# Patient Record
Sex: Male | Born: 1985 | Hispanic: Yes | Marital: Single | Smoking: Former smoker
Health system: Southern US, Community
[De-identification: ages and names within clinical notes are randomized; demographics above are authoritative.]

---

## 2008-09-26 ENCOUNTER — Emergency Department (HOSPITAL_COMMUNITY): Admission: EM | Admit: 2008-09-26 | Discharge: 2008-09-27 | Payer: Self-pay | Admitting: Emergency Medicine

## 2008-09-30 ENCOUNTER — Emergency Department (HOSPITAL_COMMUNITY): Admission: EM | Admit: 2008-09-30 | Discharge: 2008-09-30 | Payer: Self-pay | Admitting: Emergency Medicine

## 2013-03-15 ENCOUNTER — Encounter (HOSPITAL_COMMUNITY): Payer: Self-pay

## 2013-03-15 ENCOUNTER — Emergency Department (HOSPITAL_COMMUNITY)
Admission: EM | Admit: 2013-03-15 | Discharge: 2013-03-15 | Disposition: A | Discharge status: Home / Self Care | Payer: Self-pay | Attending: Emergency Medicine | Admitting: Emergency Medicine

## 2013-03-15 DIAGNOSIS — L237 Allergic contact dermatitis due to plants, except food: Secondary | ICD-10-CM

## 2013-03-15 DIAGNOSIS — IMO0002 Reserved for concepts with insufficient information to code with codable children: Secondary | ICD-10-CM | POA: Insufficient documentation

## 2013-03-15 DIAGNOSIS — Y929 Unspecified place or not applicable: Secondary | ICD-10-CM | POA: Insufficient documentation

## 2013-03-15 DIAGNOSIS — L255 Unspecified contact dermatitis due to plants, except food: Secondary | ICD-10-CM | POA: Insufficient documentation

## 2013-03-15 DIAGNOSIS — Y939 Activity, unspecified: Secondary | ICD-10-CM | POA: Insufficient documentation

## 2013-03-15 MED ORDER — HYDROXYZINE HCL 25 MG PO TABS
50.0000 mg | ORAL_TABLET | Freq: Once | ORAL | Status: AC
  Administered 2013-03-15: 50 mg via ORAL
  Filled 2013-03-15: qty 2

## 2013-03-15 MED ORDER — DEXAMETHASONE SODIUM PHOSPHATE 10 MG/ML IJ SOLN
10.0000 mg | Freq: Once | INTRAMUSCULAR | Status: AC
  Administered 2013-03-15: 10 mg via INTRAMUSCULAR
  Filled 2013-03-15: qty 1

## 2013-03-15 MED ORDER — HYDROXYZINE HCL 25 MG PO TABS: 50.0000 mg | ORAL_TABLET | Freq: Four times a day (QID) | ORAL | Status: DC | PRN

## 2013-03-15 MED ORDER — PREDNISONE 10 MG PO TABS: ORAL_TABLET | ORAL | Status: DC

## 2013-03-15 NOTE — ED Provider Notes (Signed)
   History    CSN: [id number] Arrival date & time 03/15/13  1125  First MD Initiated Contact with Patient 03/15/13 1135     Chief Complaint  Patient presents with  . Poison Ivy   (Consider location/radiation/quality/duration/timing/severity/associated sxs/prior Treatment) HPI Comments: Patient presents with poison ivy rash on his bilateral forearms.  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  Patient is a 27 y.o. male presenting with poison ivy. The history is provided by the patient. The history is limited by a language barrier. A language interpreter was used (Friends at bedside interpreted for patient.).  Poison [place]ce [place] This is a new problem. Episode onset: 2 days ago. The problem occurs constantly. The problem has been gradually worsening. Associated symptoms include a rash. Pertinent negatives include no chills, diaphoresis, fatigue, fever, numbness, vomiting or weakness. Nothing aggravates the symptoms. Treatments tried: benadryl. The treatment provided no relief.   History reviewed. No pertinent past medical history. History reviewed. No pertinent past surgical history. No family history on file. History  Substance Use Topics  . Smoking status: Not on file  . Smokeless tobacco: Not on file  . Alcohol Use: Yes    Review of Systems  Constitutional: Negative for fever, chills, diaphoresis and fatigue.  HENT: Negative for facial swelling.   Respiratory: Negative for shortness of breath and wheezing.   Gastrointestinal: Negative for vomiting.  Skin: Positive for rash.  Neurological: Negative for weakness and numbness.    Allergies  Review of patient's allergies indicates no known allergies.  Home Medications   Current Outpatient Rx  Name  Route  Sig  Dispense  Refill  . hydrOXYzine (ATARAX/VISTARIL) 25 MG tablet   Oral   Take 2 tablets (50 mg total) by mouth every 6 (six) hours as needed for itching.   30 tablet   0   . predniSONE  (DELTASONE) 10 MG tablet      Take 6 tabs daily by mouth for 2 day,  Then 5 tabs daily for 2 days,  4 tabs daily for 2 days,  3 tabs daily for 2 days,  2 tabs daily for 2 days,  Then 1 tab daily for 2 days.   42 tablet   0    BP 146/89  Pulse 92  Temp(Src) 98.2 F (36.8 C) (Oral)  Resp 20  Wt 186 lb 3.2 oz (84.46 kg)  SpO2 100% Physical Exam  Constitutional: He appears well-developed and well-nourished. No distress.  HENT:  Head: Normocephalic.  Neck: Neck supple.  Cardiovascular: Normal rate.   Pulmonary/Chest: Effort normal. He has no wheezes.  Musculoskeletal: Normal range of motion. He exhibits no edema.  Skin: Rash noted. Rash is vesicular. Rash is not pustular.  Severe contact dermatitis rash on bilateral forearms,  Hands spared.  No pustules or signs of infection.  Multiple large vesicles,  Some draining clear fluid.    ED Course  Procedures (including critical care time) Labs Reviewed - No data to display No results found. 1. Poison ivy    Atarax 50 mg po given.  Decadron 10 mg IM given. MDM  Pt prescribed a 12 day taper of prednisone to start tomorrow.  Atarax in place of benadryl if it relieves itch better.  Cool compresses,  Calamine lotion.  Recheck here if not improving over the week.  Burgess Amor, PA-C 03/15/13 1232

## 2013-03-15 NOTE — ED Provider Notes (Signed)
Medical screening examination/treatment/procedure(s) were performed by non-physician practitioner and as supervising physician I was immediately available for consultation/collaboration.  Donnetta Hutching, MD 03/15/13 [phone]

## 2013-03-15 NOTE — ED Notes (Signed)
Complain of blistering to both arms from poison ivy

## 2016-01-25 ENCOUNTER — Encounter (HOSPITAL_COMMUNITY): Payer: Self-pay | Admitting: Emergency Medicine

## 2016-01-25 ENCOUNTER — Inpatient Hospital Stay (HOSPITAL_COMMUNITY)
Admission: EM | Admit: 2016-01-25 | Discharge: 2016-01-29 | DRG: 603 | Disposition: A | Discharge status: Home / Self Care | Payer: Worker's Compensation | Attending: Internal Medicine | Admitting: Internal Medicine

## 2016-01-25 ENCOUNTER — Emergency Department (HOSPITAL_COMMUNITY): Payer: Worker's Compensation

## 2016-01-25 DIAGNOSIS — L03314 Cellulitis of groin: Secondary | ICD-10-CM | POA: Diagnosis not present

## 2016-01-25 DIAGNOSIS — F17200 Nicotine dependence, unspecified, uncomplicated: Secondary | ICD-10-CM | POA: Diagnosis present

## 2016-01-25 DIAGNOSIS — L0390 Cellulitis, unspecified: Secondary | ICD-10-CM | POA: Diagnosis present

## 2016-01-25 LAB — URINALYSIS, ROUTINE W REFLEX MICROSCOPIC
Bilirubin Urine: NEGATIVE
GLUCOSE, UA: NEGATIVE mg/dL
Hgb urine dipstick: NEGATIVE
Ketones, ur: NEGATIVE mg/dL
LEUKOCYTES UA: NEGATIVE
Nitrite: NEGATIVE
PH: 6 (ref 5.0–8.0)
PROTEIN: NEGATIVE mg/dL
Specific Gravity, Urine: <1.005 — ABNORMAL LOW (ref 1.005–1.030)

## 2016-01-25 LAB — BASIC METABOLIC PANEL
ANION GAP: 9 (ref 5–15)
BUN: 14 mg/dL (ref 6–20)
CO2: 22 mmol/L (ref 22–32)
Calcium: 9.2 mg/dL (ref 8.9–10.3)
Chloride: 102 mmol/L (ref 101–111)
Creatinine, Ser: 1.05 mg/dL (ref 0.61–1.24)
GFR calc Af Amer: >60 mL/min (ref >60)
GLUCOSE: 115 mg/dL — AB (ref 65–99)
POTASSIUM: 3.5 mmol/L (ref 3.5–5.1)
Sodium: 133 mmol/L — ABNORMAL LOW (ref 135–145)

## 2016-01-25 LAB — CBC WITH DIFFERENTIAL/PLATELET
BASOS ABS: 0 10*3/uL (ref 0.0–0.1)
Basophils Relative: 0 %
Eosinophils Absolute: 0 10*3/uL (ref 0.0–0.7)
Eosinophils Relative: 0 %
HEMATOCRIT: 44.7 % (ref 39.0–52.0)
Hemoglobin: 15.3 g/dL (ref 13.0–17.0)
LYMPHS PCT: 3 %
Lymphs Abs: 0.9 10*3/uL (ref 0.7–4.0)
MCH: 31.8 pg (ref 26.0–34.0)
MCHC: 34.2 g/dL (ref 30.0–36.0)
MCV: 92.9 fL (ref 78.0–100.0)
Monocytes Absolute: 2 10*3/uL — ABNORMAL HIGH (ref 0.1–1.0)
Monocytes Relative: 8 %
NEUTROS ABS: 21.9 10*3/uL — AB (ref 1.7–7.7)
Neutrophils Relative %: 89 %
Platelets: 184 10*3/uL (ref 150–400)
RBC: 4.81 MIL/uL (ref 4.22–5.81)
RDW: 12.8 % (ref 11.5–15.5)
WBC: 24.8 10*3/uL — AB (ref 4.0–10.5)

## 2016-01-25 MED ORDER — ONDANSETRON HCL 4 MG/2ML IJ SOLN
4.0000 mg | Freq: Once | INTRAMUSCULAR | Status: AC
  Administered 2016-01-25: 4 mg via INTRAVENOUS
  Filled 2016-01-25: qty 2

## 2016-01-25 MED ORDER — MORPHINE SULFATE (PF) 4 MG/ML IV SOLN
4.0000 mg | Freq: Once | INTRAVENOUS | Status: AC
  Administered 2016-01-25: 4 mg via INTRAVENOUS
  Filled 2016-01-25: qty 1

## 2016-01-25 MED ORDER — VANCOMYCIN HCL IN DEXTROSE 1-5 GM/200ML-% IV SOLN
1000.0000 mg | Freq: Once | INTRAVENOUS | Status: AC
  Administered 2016-01-25: 1000 mg via INTRAVENOUS
  Filled 2016-01-25: qty 200

## 2016-01-25 MED ORDER — SODIUM CHLORIDE 0.9 % IV BOLUS (SEPSIS)
1000.0000 mL | Freq: Once | INTRAVENOUS | Status: AC
  Administered 2016-01-25: 1000 mL via INTRAVENOUS

## 2016-01-25 MED ORDER — IOPAMIDOL (ISOVUE-300) INJECTION 61%
100.0000 mL | Freq: Once | INTRAVENOUS | Status: AC | PRN
  Administered 2016-01-25: 100 mL via INTRAVENOUS

## 2016-01-25 MED ORDER — PIPERACILLIN-TAZOBACTAM 3.375 G IVPB 30 MIN
3.3750 g | Freq: Once | INTRAVENOUS | Status: AC
  Administered 2016-01-25: 3.375 g via INTRAVENOUS
  Filled 2016-01-25: qty 50

## 2016-01-25 NOTE — ED Notes (Signed)
From CT 

## 2016-01-25 NOTE — ED Provider Notes (Signed)
CSN: [id number]     Arrival date & time 01/25/16  2000 History   First MD Initiated Contact with Patient 01/25/16 2110     Chief Complaint  Patient presents with  . Groin Pain     (Consider location/radiation/quality/duration/timing/severity/associated sxs/prior Treatment) Patient is a 30 y.o. male presenting with groin pain. The history is provided by the patient.  Groin Pain Pertinent negatives include no chest pain, no abdominal pain, no headaches and no shortness of breath.  Patient c/o right groin pain onset this AM.  Pain constant, severe, non radiating, worse w palpation. No hx same pain. Denies injury/strain to area. No scrotal or testicular pain. No hematuria or dysuria. No back or flank pain. No abdominal pain or nv. Denies fever or chills.      History reviewed. No pertinent past medical history. History reviewed. No pertinent past surgical history. History reviewed. No pertinent family history. Social History  Substance Use Topics  . Smoking status: Current Some Day Smoker  . Smokeless tobacco: None  . Alcohol Use: Yes    Review of Systems  Constitutional: Negative for fever.  HENT: Negative for sore throat.   Eyes: Negative for redness.  Respiratory: Negative for shortness of breath.   Cardiovascular: Negative for chest pain.  Gastrointestinal: Negative for vomiting and abdominal pain.  Genitourinary: Negative for dysuria, flank pain, scrotal swelling and testicular pain.  Musculoskeletal: Negative for back pain and neck pain.  Skin: Negative for wound.  Neurological: Negative for headaches.  Hematological: Does not bruise/bleed easily.  Psychiatric/Behavioral: Negative for confusion.      Allergies  Review of patient's allergies indicates no known allergies.  Home Medications   Prior to Admission medications   Medication Sig Start Date End Date Taking? Authorizing Provider  hydrOXYzine (ATARAX/VISTARIL) 25 MG tablet Take 2 tablets (50 mg total) by  mouth every 6 (six) hours as needed for itching. Patient not taking: Reported on 01/25/2016 03/15/13   Burgess Amor, PA-C  predniSONE (DELTASONE) 10 MG tablet Take 6 tabs daily by mouth for 2 day,  Then 5 tabs daily for 2 days,  4 tabs daily for 2 days,  3 tabs daily for 2 days,  2 tabs daily for 2 days,  Then 1 tab daily for 2 days. Patient not taking: Reported on 01/25/2016 03/15/13   Burgess Amor, PA-C   BP 118/72 mmHg  Pulse 137  Temp(Src) 98.7 F (37.1 C) (Oral)  Resp 20  Ht  (1.727 m)  Wt 86.183 kg  BMI 28.90 kg/m2  SpO2 97% Physical Exam  Constitutional: He is oriented to person, place, and time. He appears well-developed and well-nourished. No distress.  HENT:  Mouth/Throat: Oropharynx is clear and moist.  Eyes: Conjunctivae are normal. No scleral icterus.  Neck: Neck supple. No tracheal deviation present.  Cardiovascular: Normal rate, regular rhythm, normal heart sounds and intact distal pulses.   Pulmonary/Chest: Effort normal and breath sounds normal. No accessory muscle usage. No respiratory distress.  Abdominal: Soft. Bowel sounds are normal. He exhibits no distension. There is no tenderness.  No abd tenderness. Right groin with diffuse tenderness, severe, ?mild induration.  +erythema/increased warmth of skin. No fluctuance/abscess felt. No incarcerated hernia felt.  Genitourinary:  No scrotal or testicular pain/swelling/tenderness. No penil discharge. No skin lesions/ulcerations. No cva tenderness.   Musculoskeletal: Normal range of motion.  Right fem and distal pulses 2+. No femoral/inguinal l/a. No edema.   Neurological: He is alert and oriented to person, place, and time.  Skin: Skin  is warm and dry. No rash noted. He is not diaphoretic.  Psychiatric:  Anxious appearing.   Nursing note and vitals reviewed.   ED Course  Procedures (including critical care time) Labs Review  Results for orders placed or performed during the hospital encounter of 01/25/16  CBC with  Differential/Platelet  Result Value Ref Range   WBC 24.8 (H) 4.0 - 10.5 K/uL   RBC 4.81 4.22 - 5.81 MIL/uL   Hemoglobin 15.3 13.0 - 17.0 g/dL   HCT 16.144.7 09.639.0 - 04.552.0 %   MCV 92.9 78.0 - 100.0 fL   MCH 31.8 26.0 - 34.0 pg   MCHC 34.2 30.0 - 36.0 g/dL   RDW 40.912.8 81.111.5 - 91.415.5 %   Platelets 184 150 - 400 K/uL   Neutrophils Relative % 89 %   Neutro Abs 21.9 (H) 1.7 - 7.7 K/uL   Lymphocytes Relative 3 %   Lymphs Abs 0.9 0.7 - 4.0 K/uL   Monocytes Relative 8 %   Monocytes Absolute 2.0 (H) 0.1 - 1.0 K/uL   Eosinophils Relative 0 %   Eosinophils Absolute 0.0 0.0 - 0.7 K/uL   Basophils Relative 0 %   Basophils Absolute 0.0 0.0 - 0.1 K/uL  Basic metabolic panel  Result Value Ref Range   Sodium 133 (L) 135 - 145 mmol/L   Potassium 3.5 3.5 - 5.1 mmol/L   Chloride 102 101 - 111 mmol/L   CO2 22 22 - 32 mmol/L   Glucose, Bld 115 (H) 65 - 99 mg/dL   BUN 14 6 - 20 mg/dL   Creatinine, Ser 7.821.05 0.61 - 1.24 mg/dL   Calcium 9.2 8.9 - 95.610.3 mg/dL   GFR calc non Af Amer >60 >60 mL/min   GFR calc Af Amer >60 >60 mL/min   Anion gap 9 5 - 15      I have personally reviewed and evaluated these images and lab results as part of my medical decision-making.   MDM   Patient c/o severe pain to area. No crepitus. +erythema. Do not feel fluctuance/abscess, or incarcerated hernia.   No pain/tenderness to scrotum or testicle. Given degree tenderness, tachycardia, will get imaging study.  Iv ns bolus. Morphine iv. zofran iv.   vanc and zosyn iv.   Recheck pain improved.   Given cellulitis, markedly elevated wbc, degree pain, anticipate will require admit.  2250, signed out to Dr Wilkie AyeHorton that CT pending, lactate pending,  to check ct.  If ct c/w cellulitis, admit to medicine (if more extensive process, consider admit w gen surg consult).       Cathren LaineKevin Altus Zaino, MD 01/25/16 2300

## 2016-01-25 NOTE — ED Notes (Signed)
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 He states he drank 2 beers at 4pm but has taken nothing else to ease his pain.

## 2016-01-25 NOTE — ED Notes (Signed)
Pt to CT via stretcher

## 2016-01-25 NOTE — ED Notes (Addendum)
Pt c/o rt groin pain since this morning. Pt speaks very little english. Pt has a abscess to the right inguinal area.

## 2016-01-26 ENCOUNTER — Encounter (HOSPITAL_COMMUNITY): Payer: Self-pay | Admitting: *Deleted

## 2016-01-26 DIAGNOSIS — L0390 Cellulitis, unspecified: Secondary | ICD-10-CM | POA: Diagnosis present

## 2016-01-26 DIAGNOSIS — L03314 Cellulitis of groin: Secondary | ICD-10-CM | POA: Insufficient documentation

## 2016-01-26 DIAGNOSIS — L03039 Cellulitis of unspecified toe: Secondary | ICD-10-CM | POA: Diagnosis not present

## 2016-01-26 LAB — PROTIME-INR
INR: 1.19 (ref 0.00–1.49)
Prothrombin Time: 15.2 seconds (ref 11.6–15.2)

## 2016-01-26 LAB — I-STAT CG4 LACTIC ACID, ED: Lactic Acid, Venous: 1.08 mmol/L (ref 0.5–2.0)

## 2016-01-26 MED ORDER — HEPARIN SODIUM (PORCINE) 5000 UNIT/ML IJ SOLN
5000.0000 [IU] | Freq: Three times a day (TID) | INTRAMUSCULAR | Status: DC
  Administered 2016-01-26 – 2016-01-27 (×5): 5000 [IU] via SUBCUTANEOUS
  Filled 2016-01-26 (×5): qty 1

## 2016-01-26 MED ORDER — SODIUM CHLORIDE 0.9 % IV SOLN
Freq: Once | INTRAVENOUS | Status: AC
  Administered 2016-01-26: 01:00:00 via INTRAVENOUS

## 2016-01-26 MED ORDER — SODIUM CHLORIDE 0.9 % IV SOLN
1.5000 g | Freq: Four times a day (QID) | INTRAVENOUS | Status: DC
  Administered 2016-01-26 – 2016-01-29 (×12): 1.5 g via INTRAVENOUS
  Filled 2016-01-26 (×17): qty 1.5

## 2016-01-26 MED ORDER — SODIUM CHLORIDE 0.9% FLUSH: 3.0000 mL | INTRAVENOUS | Status: DC | PRN

## 2016-01-26 MED ORDER — SODIUM CHLORIDE 0.9 % IV SOLN: 250.0000 mL | INTRAVENOUS | Status: DC | PRN

## 2016-01-26 MED ORDER — HYDROCODONE-ACETAMINOPHEN 5-325 MG PO TABS
1.0000 | ORAL_TABLET | ORAL | Status: DC | PRN
  Administered 2016-01-26 (×2): 2 via ORAL
  Filled 2016-01-26 (×2): qty 2

## 2016-01-26 MED ORDER — ACETAMINOPHEN 650 MG RE SUPP: 650.0000 mg | Freq: Four times a day (QID) | RECTAL | Status: DC | PRN

## 2016-01-26 MED ORDER — SODIUM CHLORIDE 0.9% FLUSH
3.0000 mL | Freq: Two times a day (BID) | INTRAVENOUS | Status: DC
  Administered 2016-01-26 – 2016-01-28 (×6): 3 mL via INTRAVENOUS

## 2016-01-26 MED ORDER — ACETAMINOPHEN 325 MG PO TABS: 650.0000 mg | ORAL_TABLET | Freq: Four times a day (QID) | ORAL | Status: DC | PRN

## 2016-01-26 MED ORDER — VANCOMYCIN HCL IN DEXTROSE 1-5 GM/200ML-% IV SOLN
1000.0000 mg | Freq: Two times a day (BID) | INTRAVENOUS | Status: DC
  Administered 2016-01-26 – 2016-01-28 (×5): 1000 mg via INTRAVENOUS
  Filled 2016-01-26 (×5): qty 200

## 2016-01-26 MED ORDER — VANCOMYCIN HCL 10 G IV SOLR
1500.0000 mg | Freq: Two times a day (BID) | INTRAVENOUS | Status: DC
  Administered 2016-01-26: 1500 mg via INTRAVENOUS
  Filled 2016-01-26 (×3): qty 1500

## 2016-01-26 NOTE — ED Notes (Signed)
Family support 

## 2016-01-26 NOTE — Progress Notes (Signed)
PROGRESS NOTE                                                                                                                                                                                                             Patient Demographics:    Brent Roman, is a 30 y.o. male, DOB - 1986/06/15, ZOX:[id number]  Admit date - 01/25/2016   Admitting Physician Haydee Monica, MD  Outpatient Primary MD for the patient is No primary care provider on file.  LOS -     Chief Complaint  Patient presents with  . Groin Pain       Brief Narrative 30 year-old healthy Hispanic male with no previous medical problems admitted to the hospital with right groin cellulitis, CT scan of that area shows no abscess. Admitted for IV antibiotics.   Subjective:    Leonie Green today has, No headache, No chest pain, No abdominal pain - No Nausea, No new weakness tingling or numbness, No Cough - SOB.    Assessment  & Plan :      1.Right groin cellulitis. Continue IV vancomycin added Unasyn, monitor cultures, repeat CBC in the morning. No signs of fluctuance or abscess. CT scan also noted which is stable. We will monitor.    Code Status :  Full  Family Communication  :  Brother bedside  Disposition Plan  : Stay inpatient  Consults  : None  Procedures  :    CT scan pelvis showing right-sided groin cellulitis  DVT Prophylaxis  :  Started Heparin    Lab Results  Component Value Date   PLT 184 01/25/2016    Inpatient Medications  Scheduled Meds: . heparin subcutaneous  5,000 Units Subcutaneous Q8H  . sodium chloride flush  3 mL Intravenous Q12H  . vancomycin  1,500 mg Intravenous Q12H   Continuous Infusions:  PRN Meds:.sodium chloride, acetaminophen **OR** [DISCONTINUED] acetaminophen, HYDROcodone-acetaminophen, sodium chloride flush  Antibiotics  :    Anti-infectives    Start     Dose/Rate Route Frequency  Ordered Stop   01/26/16 0800  vancomycin (VANCOCIN) 1,500 mg in sodium chloride 0.9 % 500 mL IVPB     1,500 mg 250 mL/hr over 120 Minutes Intravenous Every 12 hours 01/26/16 0219     01/25/16 2230  piperacillin-tazobactam (ZOSYN) IVPB 3.375 g     3.375  g 100 mL/hr over 30 Minutes Intravenous  Once 01/25/16 2225 01/25/16 2329   01/25/16 2230  vancomycin (VANCOCIN) IVPB 1000 mg/200 mL premix     1,000 mg 200 mL/hr over 60 Minutes Intravenous  Once 01/25/16 2225 01/26/16 0023         Objective:   Filed Vitals:   01/26/16 0017 01/26/16 0030 01/26/16 0100 01/26/16 0655  BP: 91/65 93/55 106/64 105/55  Pulse: 108 104 102 76  Temp:    97.7 F (36.5 C)  TempSrc:      Resp:    17  Height:      Weight:    91.536 kg (201 lb 12.8 oz)  SpO2: 97% 96% 96% 99%    Wt Readings from Last 3 Encounters:  01/26/16 91.536 kg (201 lb 12.8 oz)  03/15/13 84.46 kg (186 lb 3.2 oz)     Intake/Output Summary (Last 24 hours) at 01/26/16 1010 Last data filed at 01/26/16 0854  Gross per 24 hour  Intake    123 ml  Output      0 ml  Net    123 ml     Physical Exam  Awake Alert, Oriented X 3, No new F.N deficits, Normal affect [place].AT,PERRAL Supple Neck,No JVD, No cervical lymphadenopathy appriciated.  Symmetrical Chest wall movement, Good air movement bilaterally, CTAB RRR,No Gallops,Rubs or new Murmurs, No Parasternal Heave +ve B.Sounds, Abd Soft, No tenderness, No organomegaly appriciated, No rebound - guarding or rigidity. No Cyanosis, Clubbing or edema, No new Rash or bruise  Right groin has a irregular about 4 cm x 2 cm area of cellulitis, no fluctuance, no visible pus point    Data Review:    CBC  Recent Labs Lab 01/25/16 2130  WBC 24.8*  HGB 15.3  HCT 44.7  PLT 184  MCV 92.9  MCH 31.8  MCHC 34.2  RDW 12.8  LYMPHSABS 0.9  MONOABS 2.0*  EOSABS 0.0  BASOSABS 0.0    Chemistries   Recent Labs Lab 01/25/16 2130  NA 133*  K 3.5  CL 102  CO2 22  GLUCOSE 115*  BUN 14    CREATININE 1.05  CALCIUM 9.2   ------------------------------------------------------------------------------------------------------------------ No results for input(s): CHOL, HDL, LDLCALC, TRIG, CHOLHDL, LDLDIRECT in the last 72 hours.  No results found for: HGBA1C ------------------------------------------------------------------------------------------------------------------ No results for input(s): TSH, T4TOTAL, T3FREE, THYROIDAB in the last 72 hours.  Invalid input(s): FREET3 ------------------------------------------------------------------------------------------------------------------ No results for input(s): VITAMINB12, FOLATE, FERRITIN, TIBC, IRON, RETICCTPCT in the last 72 hours.  Coagulation profile No results for input(s): INR, PROTIME in the last 168 hours.  No results for input(s): DDIMER in the last 72 hours.  Cardiac Enzymes No results for input(s): CKMB, TROPONINI, MYOGLOBIN in the last 168 hours.  Invalid input(s): CK ------------------------------------------------------------------------------------------------------------------ No results found for: BNP  Micro Results Recent Results (from the past 240 hour(s))  Blood culture (routine x 2)     Status: None (Preliminary result)   Collection Time: 01/26/16 12:50 AM  Result Value Ref Range Status   Specimen Description BLOOD LEFT ANTECUBITAL  Final   Special Requests BOTTLES DRAWN AEROBIC AND ANAEROBIC 8C EACH  Final   Culture PENDING  Incomplete   Report Status PENDING  Incomplete  Blood culture (routine x 2)     Status: None (Preliminary result)   Collection Time: 01/26/16  1:05 AM  Result Value Ref Range Status   Specimen Description BLOOD LEFT HAND  Final   Special Requests BOTTLES DRAWN AEROBIC AND ANAEROBIC 8CC EACH  Final   Culture PENDING  Incomplete   Report Status PENDING  Incomplete    Radiology Reports Ct Pelvis W Contrast  01/25/2016  CLINICAL DATA:  Right groin pain since this  morning. Abscess in the right inguinal region. EXAM: CT PELVIS WITH CONTRAST TECHNIQUE: Multidetector CT imaging of the pelvis was performed using the standard protocol following the bolus administration of intravenous contrast. CONTRAST:  ISOVUE-300 IOPAMIDOL (ISOVUE-300) INJECTION 61% COMPARISON:  None. FINDINGS: There is infiltration in the subcutaneous fat of the right groin region consistent with cellulitis. No loculated fluid collections suggesting no evidence of discrete abscess. Mild prominence of right groin lymph nodes, likely reactive. Visualized pelvic colon and small bowel are not distended. Scattered stool in the colon. No free air or free fluid in the pelvis. Bladder wall is not thickened. Prostate gland is not enlarged. Appendix is normal. No pelvic mass or lymphadenopathy. Visualized pelvis, sacrum, and hips appear intact. IMPRESSION: Infiltration in the subcutaneous fat of the right groin region consistent with cellulitis. No discrete abscess. Local reactive lymph nodes. Electronically Signed   By: Burman Nieves M.D.   On: 01/25/2016 22:53    Time Spent in minutes  30   Beverley Sherrard K M.D on 01/26/2016 at 10:10 AM  Between 7am to 7pm - Pager - [phone]  After 7pm go to [email or web address] - password [facility]  [facility] -  Office  [phone]

## 2016-01-26 NOTE — Progress Notes (Signed)
Cellulitis to RIGHT groin/hip region marked with a skin pen this shift. Per patient the area has spread more toward his RIGHT hip region.

## 2016-01-26 NOTE — Progress Notes (Signed)
ANTIBIOTIC CONSULT NOTE - INITIAL  Pharmacy Consult for vancomycin Indication: cellulitis  No Known Allergies  Patient Measurements: Height: 5\' 8"  (172.7 cm) Weight: 190 lb (86.183 kg) IBW/kg (Calculated) : 68.4   Vital Signs: Temp: 99.8 F (37.7 C) (05/17 2234) Temp Source: Oral (05/17 2234) BP: 106/64 mmHg (05/18 0100) Pulse Rate: 102 (05/18 0100) Intake/Output from previous day:   Intake/Output from this shift:    Labs:  Recent Labs  01/25/16 2130  WBC 24.8*  HGB 15.3  PLT 184  CREATININE 1.05   Estimated Creatinine Clearance: 109.9 mL/min (by C-G formula based on Cr of 1.05). No results for input(s): VANCOTROUGH, VANCOPEAK, VANCORANDOM, GENTTROUGH, GENTPEAK, GENTRANDOM, TOBRATROUGH, TOBRAPEAK, TOBRARND, AMIKACINPEAK, AMIKACINTROU, AMIKACIN in the last 72 hours.   Microbiology: Recent Results (from the past 720 hour(s))  Blood culture (routine x 2)     Status: None (Preliminary result)   Collection Time: 01/26/16 12:50 AM  Result Value Ref Range Status   Specimen Description BLOOD LEFT ANTECUBITAL  Final   Special Requests BOTTLES DRAWN AEROBIC AND ANAEROBIC 8C EACH  Final   Culture PENDING  Incomplete   Report Status PENDING  Incomplete  Blood culture (routine x 2)     Status: None (Preliminary result)   Collection Time: 01/26/16  1:05 AM  Result Value Ref Range Status   Specimen Description BLOOD LEFT HAND  Final   Special Requests BOTTLES DRAWN AEROBIC AND ANAEROBIC [facility]8CC EACH  Final   Culture PENDING  Incomplete   Report Status PENDING  Incomplete    Medical History: History reviewed. No pertinent past medical history.  Medications:  Prescriptions prior to admission  Medication Sig Dispense Refill Last Dose  . hydrOXYzine (ATARAX/VISTARIL) 25 MG tablet Take 2 tablets (50 mg total) by mouth every 6 (six) hours as needed for itching. (Patient not taking: Reported on 01/25/2016) 30 tablet 0   . predniSONE (DELTASONE) 10 MG tablet Take 6 tabs daily by  mouth for 2 day,  Then 5 tabs daily for 2 days,  4 tabs daily for 2 days,  3 tabs daily for 2 days,  2 tabs daily for 2 days,  Then 1 tab daily for 2 days. (Patient not taking: Reported on 01/25/2016) 42 tablet 0    Assessment: 30 yo M admitted with groin pain.  Pharmacy asked to dose vancomycin for cellulitis.  Received vanc 1g x 1 at 2335.  Also currently receiving zosyn.  Goal of Therapy:  Vancomycin trough level 10-15 mcg/ml  Plan:  - Vancomycin 1500 mg IV q12h - Follow up SCr, UOP, cultures, clinical course and adjust as clinically indicated.    Drusilla KannerGrimsley, Kaitlin Ardito Lydia 01/26/2016,2:15 AM

## 2016-01-26 NOTE — H&P (Signed)
PCP:   No primary care provider on file.   Chief Complaint:  Rash to right groin area  HPI: 30 yo healthy male comes in with one day of pain and redness to his right groin area.  No shaving in the area.  No previous skin infections.  No fevers.  No trauma to area and no draining from area.  Pt found to have a cellulitis and referred for admission for cellulitis.  Review of Systems:  Positive and negative as per HPI otherwise all other systems are negative  Past Medical History: History reviewed. No pertinent past medical history. History reviewed. No pertinent past surgical history.  none  Medications: Prior to Admission medications   Medication Sig Start Date End Date Taking? Authorizing Provider  hydrOXYzine (ATARAX/VISTARIL) 25 MG tablet Take 2 tablets (50 mg total) by mouth every 6 (six) hours as needed for itching. Patient not taking: Reported on 01/25/2016 03/15/13   Burgess Amor, PA-C  predniSONE (DELTASONE) 10 MG tablet Take 6 tabs daily by mouth for 2 day,  Then 5 tabs daily for 2 days,  4 tabs daily for 2 days,  3 tabs daily for 2 days,  2 tabs daily for 2 days,  Then 1 tab daily for 2 days. Patient not taking: Reported on 01/25/2016 03/15/13   Burgess Amor, PA-C    Allergies:  No Known Allergies  Social History:  reports that he has been smoking.  He does not have any smokeless tobacco history on file. He reports that he drinks alcohol. He reports that he does not use illicit drugs.  Family History: No premature CAD  Physical Exam: Filed Vitals:   01/25/16 2234 01/26/16 0017 01/26/16 0030 01/26/16 0100  BP: 106/64  Pulse: 110 108 104 102  Temp: 99.8 F (37.7 C)     TempSrc: Oral     Resp: 18     Height:      Weight:      SpO2: 98% 97% 96% 96%   General appearance: alert, cooperative and no distress Head: Normocephalic, without obvious abnormality, atraumatic Eyes: negative Nose: Nares normal. Septum midline. Mucosa normal. No drainage or sinus  tenderness. Neck: no JVD and supple, symmetrical, trachea midline Lungs: clear to auscultation bilaterally Heart: regular rate and rhythm, S1, S2 normal, no murmur, click, rub or gallop Abdomen: soft, non-tender; bowel sounds normal; no masses,  no organomegaly Extremities: extremities normal, atraumatic, no cyanosis or edema Pulses: 2+ and symmetric Skin: Skin color, texture, turgor normal. No rashes or lesions x small 5cm area of cellulitis to right groin area with no induration/flunctuance c/w absess.  No open wounds. Neurologic: Grossly normal    Labs on Admission:   Recent Labs  01/25/16 2130  NA 133*  K 3.5  CL 102  CO2 22  GLUCOSE 115*  BUN 14  CREATININE 1.05  CALCIUM 9.2      Recent Labs  01/25/16 2130  WBC 24.8*  NEUTROABS 21.9*  HGB 15.3  HCT 44.7  MCV 92.9  PLT 184    Radiological Exams on Admission: Ct Pelvis W Contrast  01/25/2016  CLINICAL DATA:  Right groin pain since this morning. Abscess in the right inguinal region. EXAM: CT PELVIS WITH CONTRAST TECHNIQUE: Multidetector CT imaging of the pelvis was performed using the standard protocol following the bolus administration of intravenous contrast. CONTRAST:  ISOVUE-300 IOPAMIDOL (ISOVUE-300) INJECTION 61% COMPARISON:  None. FINDINGS: There is infiltration in the subcutaneous fat of the right groin region consistent  with cellulitis. No loculated fluid collections suggesting no evidence of discrete abscess. Mild prominence of right groin lymph nodes, likely reactive. Visualized pelvic colon and small bowel are not distended. Scattered stool in the colon. No free air or free fluid in the pelvis. Bladder wall is not thickened. Prostate gland is not enlarged. Appendix is normal. No pelvic mass or lymphadenopathy. Visualized pelvis, sacrum, and hips appear intact. IMPRESSION: Infiltration in the subcutaneous fat of the right groin region consistent with cellulitis. No discrete abscess. Local reactive lymph  nodes. Electronically Signed   By: Burman NievesWilliam  Stevens M.D.   On: 01/25/2016 22:53    Assessment/Plan  30 yo male with right groin cellulitis  Principal Problem:   Cellulitis- give vancomycin iv.  Pt well appearing.  Likely will improve in the next day or two  obs on medical.  Full code.  Azalie Harbeck A 01/26/2016, 1:22 AM

## 2016-01-26 NOTE — Progress Notes (Signed)
ANTIBIOTIC CONSULT NOTE - INITIAL  Pharmacy Consult for vancomycin and Unasyn (added today) Indication: cellulitis  No Known Allergies  Patient Measurements: Height: 5\' 8"  (172.7 cm) Weight: 201 lb 12.8 oz (91.536 kg) IBW/kg (Calculated) : 68.4  Vital Signs: Temp: 97.7 F (36.5 C) (05/18 0655) Temp Source: Oral (05/17 2234) BP: 105/55 mmHg (05/18 0655) Pulse Rate: 76 (05/18 0655) Intake/Output from previous day:   Intake/Output from this shift: Total I/O In: 123 [P.O.:120; I.V.:3] Out: -   Labs:  Recent Labs  01/25/16 2130  WBC 24.8*  HGB 15.3  PLT 184  CREATININE 1.05   Estimated Creatinine Clearance: 112.9 mL/min (by C-G formula based on Cr of 1.05). No results for input(s): VANCOTROUGH, VANCOPEAK, VANCORANDOM, GENTTROUGH, GENTPEAK, GENTRANDOM, TOBRATROUGH, TOBRAPEAK, TOBRARND, AMIKACINPEAK, AMIKACINTROU, AMIKACIN in the last 72 hours.   Microbiology: Recent Results (from the past 720 hour(s))  Blood culture (routine x 2)     Status: None (Preliminary result)   Collection Time: 01/26/16 12:50 AM  Result Value Ref Range Status   Specimen Description BLOOD LEFT ANTECUBITAL  Final   Special Requests BOTTLES DRAWN AEROBIC AND ANAEROBIC 8C EACH  Final   Culture PENDING  Incomplete   Report Status PENDING  Incomplete  Blood culture (routine x 2)     Status: None (Preliminary result)   Collection Time: 01/26/16  1:05 AM  Result Value Ref Range Status   Specimen Description BLOOD LEFT HAND  Final   Special Requests BOTTLES DRAWN AEROBIC AND ANAEROBIC [facility]8CC EACH  Final   Culture PENDING  Incomplete   Report Status PENDING  Incomplete   Medical History: History reviewed. No pertinent past medical history.  Medications:  Prescriptions prior to admission  Medication Sig Dispense Refill Last Dose  . hydrOXYzine (ATARAX/VISTARIL) 25 MG tablet Take 2 tablets (50 mg total) by mouth every 6 (six) hours as needed for itching. (Patient not taking: Reported on 01/25/2016) 30  tablet 0   . predniSONE (DELTASONE) 10 MG tablet Take 6 tabs daily by mouth for 2 day,  Then 5 tabs daily for 2 days,  4 tabs daily for 2 days,  3 tabs daily for 2 days,  2 tabs daily for 2 days,  Then 1 tab daily for 2 days. (Patient not taking: Reported on 01/25/2016) 42 tablet 0    Assessment: 30 yo M admitted with groin pain.  Vancomycin was started for cellulitis, now pharmacy asked to add Unasyn.  Renal fxn OK.  NKA.  Blood cx's pending.   Goal of Therapy:  Vancomycin trough level 10-15 mcg/ml  Plan:  - Vancomycin 1000 mg IV q12h (goal trough = 10-15) Check Vancomycin trough level at steady state if continues - Unasyn 1.5gm IV q6hrs Follow up SCr, UOP, cultures, clinical course and adjust as clinically indicated.  Valrie HartScott Adamariz Gillott, PharmD Clinical Pharmacist Pager:  [phone] 01/26/2016 10:32 AM

## 2016-01-27 DIAGNOSIS — L03314 Cellulitis of groin: Secondary | ICD-10-CM | POA: Diagnosis present

## 2016-01-27 DIAGNOSIS — L03039 Cellulitis of unspecified toe: Secondary | ICD-10-CM

## 2016-01-27 DIAGNOSIS — F17200 Nicotine dependence, unspecified, uncomplicated: Secondary | ICD-10-CM | POA: Diagnosis present

## 2016-01-27 LAB — BASIC METABOLIC PANEL
ANION GAP: 7 (ref 5–15)
BUN: 9 mg/dL (ref 6–20)
CHLORIDE: 101 mmol/L (ref 101–111)
CO2: 27 mmol/L (ref 22–32)
Calcium: 8.5 mg/dL — ABNORMAL LOW (ref 8.9–10.3)
Creatinine, Ser: 0.78 mg/dL (ref 0.61–1.24)
GFR calc Af Amer: >60 mL/min (ref >60)
GLUCOSE: 89 mg/dL (ref 65–99)
POTASSIUM: 3.7 mmol/L (ref 3.5–5.1)
SODIUM: 135 mmol/L (ref 135–145)

## 2016-01-27 LAB — CBC
HCT: 42.9 % (ref 39.0–52.0)
HEMOGLOBIN: 14.5 g/dL (ref 13.0–17.0)
MCH: 32.2 pg (ref 26.0–34.0)
MCHC: 33.8 g/dL (ref 30.0–36.0)
MCV: 95.3 fL (ref 78.0–100.0)
PLATELETS: 183 10*3/uL (ref 150–400)
RBC: 4.5 MIL/uL (ref 4.22–5.81)
RDW: 13.1 % (ref 11.5–15.5)
WBC: 18 10*3/uL — AB (ref 4.0–10.5)

## 2016-01-27 MED ORDER — HYDROCODONE-ACETAMINOPHEN 5-325 MG PO TABS
1.0000 | ORAL_TABLET | ORAL | Status: DC | PRN
  Administered 2016-01-27 – 2016-01-29 (×3): 1 via ORAL
  Filled 2016-01-27 (×3): qty 1

## 2016-01-27 NOTE — Clinical Documentation Improvement (Signed)
Hospitalist Please update your documentation within the medical record to reflect your response to this query. Thank you Based on the clinical findings below, please document any associated diagnoses/conditions the patient has or may have.   Possible/ Likely/ Probable Sepsis  Other  Clinically Undetermined  Supporting Information: 01/26/16 H&P... 01/25/16 2234  01/26/16 0017  01/26/16 0030  01/26/16 0100  BP: 99/68   91/65    93/55    106/64  Pulse: 110   108    104    102  Results for Brent Roman, Brent Roman (MRN [id number]) as of 01/27/2016 15:06  01/25/2016 21:30 01/27/2016 04:56  WBC 24.8 (H) 18.0 (H)  01/27/16 prgr note...". Right groin cellulitis. Continue IV vancomycin added Unasyn, monitor cultures, repeat CBC in the morning. No signs of fluctuance or abscess..."..Marland Kitchen."Clinically redness has improved and so has leukocytosis, blood cultures remain negative. Continue present medications with IV antibiotics for another 24-48 hours"...  Please exercise your independent, professional judgment when responding. Roman specific answer is not anticipated or expected.  Thank You, Toribio Harbourphelia R Tesa Meadors, RN, BSN, CCDS Certified Clinical Documentation Specialist [place]: Health Information Management [phone]

## 2016-01-27 NOTE — Care Management Note (Signed)
Case Management Note  Patient Details  Name: Brent Roman MRN: [id number] Date of Birth: 1985-11-01  Subjective/Objective:                  Pt admitted with cellulitis. Pt is from home and is ind with ADL's. Pt is employed but uninsured. Pt has had visit from financial counselor. Pt plans to discharge home with self care over weekend. Pt says he will be able to afford abx at DC. Pt given list of PCP's in [place] who will see uninsured pt's. Pt unsure which option he wants to pursue at this time but says he will call medical clinic of choice after DC.   Action/Plan: DC home with self care. Pt responsible for arranging f/u care per his choice.   Expected Discharge Date:    01/28/2016              Expected Discharge Plan:  Home/Self Care  In-House Referral:  Financial Counselor  Discharge planning Services  CM Consult, Indigent Health Clinic  Post Acute Care Choice:  NA Choice offered to:  NA  DME Arranged:    DME Agency:     HH Arranged:    HH Agency:     Status of Service:  Completed, signed off  Medicare Important Message Given:    Date Medicare IM Given:    Medicare IM give by:    Date Additional Medicare IM Given:    Additional Medicare Important Message give by:     If discussed at Long Length of Stay Meetings, dates discussed:    Additional Comments:  Malcolm MetroChildress, Beck Cofer Demske, RN 01/27/2016, 1:11 PM

## 2016-01-27 NOTE — Progress Notes (Signed)
PROGRESS NOTE                                                                                                                                                                                                             Patient Demographics:    Brent GreenOmar Roman, is a 30 y.o. male, DOB - [birth year], ZOX:[id number]  Admit date - 01/25/2016   Admitting Physician Haydee Monicaachal A David, MD  Outpatient Primary MD for the patient is No primary care provider on file.  LOS -     Chief Complaint  Patient presents with  . Groin Pain       Brief Narrative 30 year-old healthy Hispanic male with no previous medical problems admitted to the hospital with right groin cellulitis, CT scan of that area shows no abscess. Admitted for IV antibiotics.   Subjective:    Brent Greenmar Roman today has, No headache, No chest pain, No abdominal pain - No Nausea, No new weakness tingling or numbness, No Cough - SOB.    Assessment  & Plan :    1. Right groin cellulitis. Continue IV vancomycin added Unasyn, monitor cultures, repeat CBC in the morning. No signs of fluctuance or abscess. CT scan also noted which is stable. We will monitor.Clinically redness has improved and so has leukocytosis, blood cultures remain negative. Continue present medications with IV antibiotics for another 24-48 hours.  Code Status :  Full  Family Communication  :  Brother bedside  Disposition Plan  : Stay inpatient  Consults  : None  Procedures  :    CT scan pelvis showing right-sided groin cellulitis  DVT Prophylaxis  :  Started Heparin    Lab Results  Component Value Date   PLT 183 01/27/2016    Inpatient Medications  Scheduled Meds: . ampicillin-sulbactam (UNASYN) IV  1.5 g Intravenous Q6H  . heparin subcutaneous  5,000 Units Subcutaneous Q8H  . sodium chloride flush  3 mL Intravenous Q12H  . vancomycin  1,000 mg Intravenous Q12H   Continuous  Infusions:  PRN Meds:.sodium chloride, acetaminophen **OR** [DISCONTINUED] acetaminophen, HYDROcodone-acetaminophen, sodium chloride flush  Antibiotics  :    Anti-infectives    Start     Dose/Rate Route Frequency Ordered Stop   01/26/16 2000  vancomycin (VANCOCIN) IVPB 1000 mg/200 mL premix     1,000 mg 200 mL/hr over 60 Minutes  Intravenous Every 12 hours 01/26/16 1023     01/26/16 1200  ampicillin-sulbactam (UNASYN) 1.5 g in sodium chloride 0.9 % 50 mL IVPB     1.5 g 100 mL/hr over 30 Minutes Intravenous Every 6 hours 01/26/16 1021     01/26/16 0800  vancomycin (VANCOCIN) 1,500 mg in sodium chloride 0.9 % 500 mL IVPB  Status:  Discontinued     1,500 mg 250 mL/hr over 120 Minutes Intravenous Every 12 hours 01/26/16 0219 01/26/16 1023   01/25/16 2230  piperacillin-tazobactam (ZOSYN) IVPB 3.375 g     3.375 g 100 mL/hr over 30 Minutes Intravenous  Once 01/25/16 2225 01/25/16 2329   01/25/16 2230  vancomycin (VANCOCIN) IVPB 1000 mg/200 mL premix     1,000 mg 200 mL/hr over 60 Minutes Intravenous  Once 01/25/16 2225 01/26/16 0023         Objective:   Filed Vitals:   01/26/16 0100 01/26/16 0655 01/26/16 2300 01/27/16 0640  BP: 106/64 105/55 112/59 110/57  Pulse: 102 76 73 70  Temp:  97.7 F (36.5 C) 98.5 F (36.9 C) 97.3 F (36.3 C)  TempSrc:   Oral Oral  Resp:  Height:      Weight:  91.536 kg (201 lb 12.8 oz)    SpO2: 96% 99% 100% 96%    Wt Readings from Last 3 Encounters:  01/26/16 91.536 kg (201 lb 12.8 oz)  03/15/13 84.46 kg (186 lb 3.2 oz)    No intake or output data in the 24 hours ending 01/27/16 0929   Physical Exam  Awake Alert, Oriented X 3, No new F.N deficits, Normal affect [place].AT,PERRAL Supple Neck,No JVD, No cervical lymphadenopathy appriciated.  Symmetrical Chest wall movement, Good air movement bilaterally, CTAB RRR,No Gallops,Rubs or new Murmurs, No Parasternal Heave +ve B.Sounds, Abd Soft, No tenderness, No organomegaly appriciated, No  rebound - guarding or rigidity. No Cyanosis, Clubbing or edema, No new Rash or bruise  Right groin has a irregular about 6 cm x 2 cm area of cellulitis, no fluctuance, no visible pus point    Data Review:    CBC  Recent Labs Lab 01/25/16 2130 01/27/16 0456  WBC 24.8* 18.0*  HGB 15.3 14.5  HCT 44.7 42.9  PLT 184 183  MCV 92.9 95.3  MCH 31.8 32.2  MCHC 34.2 33.8  RDW 12.8 13.1  LYMPHSABS 0.9  --   MONOABS 2.0*  --   EOSABS 0.0  --   BASOSABS 0.0  --     Chemistries   Recent Labs Lab 01/25/16 2130 01/27/16 0456  NA 133* 135  K 3.5 3.7  CL 102 101  CO2 22 27  GLUCOSE 115* 89  BUN 14 9  CREATININE 1.05 0.78  CALCIUM 9.2 8.5*   ------------------------------------------------------------------------------------------------------------------ No results for input(s): CHOL, HDL, LDLCALC, TRIG, CHOLHDL, LDLDIRECT in the last 72 hours.  No results found for: HGBA1C ------------------------------------------------------------------------------------------------------------------ No results for input(s): TSH, T4TOTAL, T3FREE, THYROIDAB in the last 72 hours.  Invalid input(s): FREET3 ------------------------------------------------------------------------------------------------------------------ No results for input(s): VITAMINB12, FOLATE, FERRITIN, TIBC, IRON, RETICCTPCT in the last 72 hours.  Coagulation profile  Recent Labs Lab 01/26/16 1100  INR 1.19    No results for input(s): DDIMER in the last 72 hours.  Cardiac Enzymes No results for input(s): CKMB, TROPONINI, MYOGLOBIN in the last 168 hours.  Invalid input(s): CK ------------------------------------------------------------------------------------------------------------------ No results found for: BNP  Micro Results Recent Results (from the past 240 hour(s))  Blood culture (routine x 2)  Status: None (Preliminary result)   Collection Time: 01/26/16 12:50 AM  Result Value Ref Range Status    Specimen Description BLOOD LEFT ANTECUBITAL  Final   Special Requests BOTTLES DRAWN AEROBIC AND ANAEROBIC 8C EACH  Final   Culture NO GROWTH < 12 HOURS  Final   Report Status PENDING  Incomplete  Blood culture (routine x 2)     Status: None (Preliminary result)   Collection Time: 01/26/16  1:05 AM  Result Value Ref Range Status   Specimen Description BLOOD LEFT HAND  Final   Special Requests BOTTLES DRAWN AEROBIC AND ANAEROBIC 8CC EACH  Final   Culture PENDING  Incomplete   Report Status PENDING  Incomplete    Radiology Reports Ct Pelvis W Contrast  01/25/2016  CLINICAL DATA:  Right groin pain since this morning. Abscess in the right inguinal region. EXAM: CT PELVIS WITH CONTRAST TECHNIQUE: Multidetector CT imaging of the pelvis was performed using the standard protocol following the bolus administration of intravenous contrast. CONTRAST:  ISOVUE-300 IOPAMIDOL (ISOVUE-300) INJECTION 61% COMPARISON:  None. FINDINGS: There is infiltration in the subcutaneous fat of the right groin region consistent with cellulitis. No loculated fluid collections suggesting no evidence of discrete abscess. Mild prominence of right groin lymph nodes, likely reactive. Visualized pelvic colon and small bowel are not distended. Scattered stool in the colon. No free air or free fluid in the pelvis. Bladder wall is not thickened. Prostate gland is not enlarged. Appendix is normal. No pelvic mass or lymphadenopathy. Visualized pelvis, sacrum, and hips appear intact. IMPRESSION: Infiltration in the subcutaneous fat of the right groin region consistent with cellulitis. No discrete abscess. Local reactive lymph nodes. Electronically Signed   By: Burman Nieves M.D.   On: 01/25/2016 22:53    Time Spent in minutes  30   Greyden Besecker K M.D on 01/27/2016 at 9:29 AM  Between 7am to 7pm - Pager - [phone]  After 7pm go to [email or web address] - password [facility]  [facility] -  Office  [phone]

## 2016-01-28 LAB — CBC
HEMATOCRIT: 43.5 % (ref 39.0–52.0)
HEMOGLOBIN: 14.8 g/dL (ref 13.0–17.0)
MCH: 32.2 pg (ref 26.0–34.0)
MCHC: 34 g/dL (ref 30.0–36.0)
MCV: 94.6 fL (ref 78.0–100.0)
Platelets: 197 10*3/uL (ref 150–400)
RBC: 4.6 MIL/uL (ref 4.22–5.81)
RDW: 13 % (ref 11.5–15.5)
WBC: 12.7 10*3/uL — ABNORMAL HIGH (ref 4.0–10.5)

## 2016-01-28 MED ORDER — ENOXAPARIN SODIUM 40 MG/0.4ML ~~LOC~~ SOLN
40.0000 mg | SUBCUTANEOUS | Status: DC
  Administered 2016-01-28: 40 mg via SUBCUTANEOUS
  Filled 2016-01-28 (×2): qty 0.4

## 2016-01-28 NOTE — Progress Notes (Signed)
PROGRESS NOTE                                                                                                                                                                                                             Patient Demographics:    Brent Roman, is a 30 y.o. male, DOB - September 17, 1985, WUJ:[id number]  Admit date - 01/25/2016   Admitting Physician Haydee Monica, MD  Outpatient Primary MD for the patient is No primary care provider on file.  LOS -     Chief Complaint  Patient presents with  . Groin Pain       Brief Narrative 30 year-old healthy Hispanic male with no previous medical problems admitted to the hospital with right groin cellulitis, CT scan of that area shows no abscess. Admitted for IV antibiotics.   Subjective:    Brent Roman today has, No headache, No chest pain, No abdominal pain - No Nausea, No new weakness tingling or numbness, No Cough - SOB.    Assessment  & Plan :    1. Right groin cellulitis. Continue IV vancomycin added Unasyn, monitor cultures, repeat CBC in the morning. No signs of fluctuance or abscess. CT scan also noted which is stable. We will monitor.Clinically redness has improved and so has leukocytosis, blood cultures remain negative. Continue present medications with IV antibiotics for another 24 hours.No sepsis.  Code Status :  Full  Family Communication  :  Brother bedside  Disposition Plan  : Stay inpatientLikely discharge in the morning if cellulitis continues to improve.  Consults  : None  Procedures  :    CT scan pelvis showing right-sided groin cellulitis  DVT Prophylaxis  :  Per patient request switched to Lovenox    Lab Results  Component Value Date   PLT 197 01/28/2016    Inpatient Medications  Scheduled Meds: . ampicillin-sulbactam (UNASYN) IV  1.5 g Intravenous Q6H  . enoxaparin (LOVENOX) injection  40 mg Subcutaneous Q24H  .  sodium chloride flush  3 mL Intravenous Q12H  . vancomycin  1,000 mg Intravenous Q12H   Continuous Infusions:  PRN Meds:.sodium chloride, acetaminophen **OR** [DISCONTINUED] acetaminophen, HYDROcodone-acetaminophen, sodium chloride flush  Antibiotics  :    Anti-infectives    Start     Dose/Rate Route Frequency Ordered Stop   01/26/16 2000  vancomycin (VANCOCIN) IVPB  1000 mg/200 mL premix     1,000 mg 200 mL/hr over 60 Minutes Intravenous Every 12 hours 01/26/16 1023     01/26/16 1200  ampicillin-sulbactam (UNASYN) 1.5 g in sodium chloride 0.9 % 50 mL IVPB     1.5 g 100 mL/hr over 30 Minutes Intravenous Every 6 hours 01/26/16 1021     01/26/16 0800  vancomycin (VANCOCIN) 1,500 mg in sodium chloride 0.9 % 500 mL IVPB  Status:  Discontinued     1,500 mg 250 mL/hr over 120 Minutes Intravenous Every 12 hours 01/26/16 0219 01/26/16 1023   01/25/16 2230  piperacillin-tazobactam (ZOSYN) IVPB 3.375 g     3.375 g 100 mL/hr over 30 Minutes Intravenous  Once 01/25/16 2225 01/25/16 2329   01/25/16 2230  vancomycin (VANCOCIN) IVPB 1000 mg/200 mL premix     1,000 mg 200 mL/hr over 60 Minutes Intravenous  Once 01/25/16 2225 01/26/16 0023         Objective:   Filed Vitals:   01/27/16 0640 01/27/16 1456 01/27/16 2136 01/28/16 0512  BP: 110/57 95/59 112/70 111/82  Pulse: 70 77 78 67  Temp: 97.3 F (36.3 C) 97.7 F (36.5 C) 99.3 F (37.4 C) 97.9 F (36.6 C)  TempSrc: Oral Oral Oral Oral  Resp: 18 18 20 20   Height:      Weight:      SpO2: 96% 100% 99% 100%    Wt Readings from Last 3 Encounters:  01/26/16 91.536 kg (201 lb 12.8 oz)  03/15/13 84.46 kg (186 lb 3.2 oz)     Intake/Output Summary (Last 24 hours) at 01/28/16 0807 Last data filed at 01/28/16 [id number]  Gross per 24 hour  Intake   1920 ml  Output      0 ml  Net   1920 ml     Physical Exam  Awake Alert, Oriented X 3, No new F.N deficits, Normal affect [place].AT,PERRAL Supple Neck,No JVD, No cervical lymphadenopathy  appriciated.  Symmetrical Chest wall movement, Good air movement bilaterally, CTAB RRR,No Gallops,Rubs or new Murmurs, No Parasternal Heave +ve B.Sounds, Abd Soft, No tenderness, No organomegaly appriciated, No rebound - guarding or rigidity. No Cyanosis, Clubbing or edema, No new Rash or bruise  Right groin has a irregular about 6 cm x 2 cm area of cellulitis, no fluctuance, no visible pus point    Data Review:    CBC  Recent Labs Lab 01/25/16 2130 01/27/16 0456 01/28/16 0513  WBC 24.8* 18.0* 12.7*  HGB 15.3 14.5 14.8  HCT 44.7 42.9 43.5  PLT 184 183 197  MCV 92.9 95.3 94.6  MCH 31.8 32.2 32.2  MCHC 34.2 33.8 34.0  RDW 12.8 13.1 13.0  LYMPHSABS 0.9  --   --   MONOABS 2.0*  --   --   EOSABS 0.0  --   --   BASOSABS 0.0  --   --     Chemistries   Recent Labs Lab 01/25/16 2130 01/27/16 0456  NA 133* 135  K 3.5 3.7  CL 102 101  CO2 22 27  GLUCOSE 115* 89  BUN 14 9  CREATININE 1.05 0.78  CALCIUM 9.2 8.5*   ------------------------------------------------------------------------------------------------------------------ No results for input(s): CHOL, HDL, LDLCALC, TRIG, CHOLHDL, LDLDIRECT in the last 72 hours.  No results found for: HGBA1C ------------------------------------------------------------------------------------------------------------------ No results for input(s): TSH, T4TOTAL, T3FREE, THYROIDAB in the last 72 hours.  Invalid input(s): FREET3 ------------------------------------------------------------------------------------------------------------------ No results for input(s): VITAMINB12, FOLATE, FERRITIN, TIBC, IRON, RETICCTPCT in the last 72 hours.  Coagulation  profile  Recent Labs Lab 01/26/16 1100  INR 1.19    No results for input(s): DDIMER in the last 72 hours.  Cardiac Enzymes No results for input(s): CKMB, TROPONINI, MYOGLOBIN in the last 168 hours.  Invalid input(s):  CK ------------------------------------------------------------------------------------------------------------------ No results found for: BNP  Micro Results Recent Results (from the past 240 hour(s))  Blood culture (routine x 2)     Status: None (Preliminary result)   Collection Time: 01/26/16 12:50 AM  Result Value Ref Range Status   Specimen Description BLOOD LEFT ANTECUBITAL  Final   Special Requests BOTTLES DRAWN AEROBIC AND ANAEROBIC 8C EACH  Final   Culture NO GROWTH 2 DAYS  Final   Report Status PENDING  Incomplete  Blood culture (routine x 2)     Status: None (Preliminary result)   Collection Time: 01/26/16  1:05 AM  Result Value Ref Range Status   Specimen Description BLOOD LEFT HAND  Final   Special Requests BOTTLES DRAWN AEROBIC AND ANAEROBIC 8CC EACH  Final   Culture PENDING  Incomplete   Report Status PENDING  Incomplete    Radiology Reports Ct Pelvis W Contrast  01/25/2016  CLINICAL DATA:  Right groin pain since this morning. Abscess in the right inguinal region. EXAM: CT PELVIS WITH CONTRAST TECHNIQUE: Multidetector CT imaging of the pelvis was performed using the standard protocol following the bolus administration of intravenous contrast. CONTRAST:  ISOVUE-300 IOPAMIDOL (ISOVUE-300) INJECTION 61% COMPARISON:  None. FINDINGS: There is infiltration in the subcutaneous fat of the right groin region consistent with cellulitis. No loculated fluid collections suggesting no evidence of discrete abscess. Mild prominence of right groin lymph nodes, likely reactive. Visualized pelvic colon and small bowel are not distended. Scattered stool in the colon. No free air or free fluid in the pelvis. Bladder wall is not thickened. Prostate gland is not enlarged. Appendix is normal. No pelvic mass or lymphadenopathy. Visualized pelvis, sacrum, and hips appear intact. IMPRESSION: Infiltration in the subcutaneous fat of the right groin region consistent with cellulitis. No discrete  abscess. Local reactive lymph nodes. Electronically Signed   By: Burman Nieves M.D.   On: 01/25/2016 22:53    Time Spent in minutes  30   Ceylon Arenson K M.D on 01/28/2016 at 8:07 AM  Between 7am to 7pm - Pager - [phone]  After 7pm go to [email or web address] - password [facility]  [facility] -  Office  [phone]

## 2016-01-29 DIAGNOSIS — L03314 Cellulitis of groin: Principal | ICD-10-CM

## 2016-01-29 MED ORDER — AMOXICILLIN-POT CLAVULANATE 875-125 MG PO TABS
1.0000 | ORAL_TABLET | Freq: Two times a day (BID) | ORAL | Status: DC
  Administered 2016-01-29: 1 via ORAL
  Filled 2016-01-29: qty 1

## 2016-01-29 MED ORDER — AMOXICILLIN-POT CLAVULANATE 875-125 MG PO TABS: 1.0000 | ORAL_TABLET | Freq: Two times a day (BID) | ORAL | Status: DC

## 2016-01-29 MED ORDER — SULFAMETHOXAZOLE-TRIMETHOPRIM 800-160 MG PO TABS
1.0000 | ORAL_TABLET | Freq: Two times a day (BID) | ORAL | Status: DC
  Administered 2016-01-29: 1 via ORAL
  Filled 2016-01-29: qty 1

## 2016-01-29 MED ORDER — SULFAMETHOXAZOLE-TRIMETHOPRIM 800-160 MG PO TABS: 1.0000 | ORAL_TABLET | Freq: Two times a day (BID) | ORAL | Status: DC

## 2016-01-29 NOTE — Discharge Instructions (Signed)
Follow with Primary MD  in 7 days  ° °Get CBC, CMP, 2 view Chest X ray checked  by Primary MD next visit.  ° ° °Activity: As tolerated with Full fall precautions use walker/cane & assistance as needed ° ° °Disposition Home  ° ° °Diet:   Heart Healthy  . ° °For Heart failure patients - Check your Weight same time everyday, if you gain over 2 pounds, or you develop in leg swelling, experience more shortness of breath or chest pain, call your Primary MD immediately. Follow Cardiac Low Salt Diet and 1.5 lit/day fluid restriction. ° ° °On your next visit with your primary care physician please Get Medicines reviewed and adjusted. ° ° °Please request your Prim.MD to go over all Hospital Tests and Procedure/Radiological results at the follow up, please get all Hospital records sent to your Prim MD by signing hospital release before you go home. ° ° °If you experience worsening of your admission symptoms, develop shortness of breath, life threatening emergency, suicidal or homicidal thoughts you must seek medical attention immediately by calling 911 or calling your MD immediately  if symptoms less severe. ° °You Must read complete instructions/literature along with all the possible adverse reactions/side effects for all the Medicines you take and that have been prescribed to you. Take any new Medicines after you have completely understood and accpet all the possible adverse reactions/side effects.  ° °Do not drive, operate heavy machinery, perform activities at heights, swimming or participation in water activities or provide baby sitting services if your were admitted for syncope or siezures until you have seen by Primary MD or a Neurologist and advised to do so again. ° °Do not drive when taking Pain medications.  ° ° °Do not take more than prescribed Pain, Sleep and Anxiety Medications ° °Special Instructions: If you have smoked or chewed Tobacco  in the last 2 yrs please stop smoking, stop any regular Alcohol  and or  any Recreational drug use. ° °Wear Seat belts while driving. ° ° °Please note ° °You were cared for by a hospitalist during your hospital stay. If you have any questions about your discharge medications or the care you received while you were in the hospital after you are discharged, you can call the unit and asked to speak with the hospitalist on call if the hospitalist that took care of you is not available. Once you are discharged, your primary care physician will handle any further medical issues. Please note that NO REFILLS for any discharge medications will be authorized once you are discharged, as it is imperative that you return to your primary care physician (or establish a relationship with a primary care physician if you do not have one) for your aftercare needs so that they can reassess your need for medications and monitor your lab values. ° ° °

## 2016-01-29 NOTE — Plan of Care (Signed)
     Brent Roman was admitted to the Hospital on 01/25/2016 and Discharged  01/29/2016 and should be excused from work/school   for 7 days starting 01/25/2016 , may return to work/school without any restrictions.  Call Brent RaringPrashant Irene Mitcham MD, [facility]  [phone] with questions.  Brent Roman,Brent Roman on 01/29/2016,at 7:47 AM  [facility]   Office  [phone]

## 2016-01-29 NOTE — Progress Notes (Signed)
Patient states understanding of discharge instructions, prescriptions given 

## 2016-01-29 NOTE — Discharge Summary (Signed)
Brent Roman, is a 30 y.o. male  DOB 25-Aug-1986  MRN [id number].  Admission date:  01/25/2016  Admitting Physician  Haydee Monica, MD  Discharge Date:  01/29/2016   Primary MD  No primary care provider on file.  Recommendations for primary care physician for things to follow:   Monitor cellulitis in the R groin clinically   Admission Diagnosis  Cellulitis of right groin [L03.314]   Discharge Diagnosis  Cellulitis of right groin [L03.314]     Principal Problem:   Cellulitis Active Problems:   Cellulitis of left groin      History reviewed. No pertinent past medical history.  History reviewed. No pertinent past surgical history.     HPI  from the history and physical done on the day of admission:    Brief Narrative 30 year-old healthy Hispanic male with no previous medical problems admitted to the hospital with right groin cellulitis, CT scan of that area shows no abscess. Admitted for IV antibiotics.    Hospital Course:     Right groin cellulitis. Continue IV vancomycin added Unasyn, monitor cultures, repeat CBC in the morning. No signs of fluctuance or abscess. CT scan also noted which is stable. We will monitor.Clinically redness has improved and so has leukocytosis, blood cultures remain negative. No sepsis.  Cellulitis now almost completely resolved will be placed on 5 more days of Bactrim & Augmentin, Case Manager to arrange for PCP/follow up.       Follow UP  Follow-up Information    Follow up with [facility] Schedule an appointment as soon as possible for a visit in 3 days.   Why:  or any PCP    Contact information:   [address] [place] [address] [phone]       Consults obtained - None  Discharge Condition:  Stable  Diet and Activity recommendation: See Discharge Instructions below  Discharge Instructions       Discharge Instructions    Diet - low sodium heart healthy    Complete by:  As directed      Discharge instructions    Complete by:  As directed   Follow with Primary MD  in 7 days   Get CBC, CMP, 2 view Chest X ray checked  by Primary MD next visit.    Activity: As tolerated with Full fall precautions use walker/cane & assistance as needed   Disposition Home     Diet:   Heart Healthy   For Heart failure patients - Check your Weight same time everyday, if you gain over 2 pounds, or you develop in leg swelling, experience more shortness of breath or chest pain, call your Primary MD immediately. Follow Cardiac Low Salt Diet and 1.5 lit/day fluid restriction.   On your next visit with your primary care physician please Get Medicines reviewed and adjusted.   Please request your Prim.MD to go over all Hospital Tests and Procedure/Radiological results at the follow up, please get all [facility]  records sent to your Prim MD by signing hospital release before you go home.   If you experience worsening of your admission symptoms, develop shortness of breath, life threatening emergency, suicidal or homicidal thoughts you must seek medical attention immediately by calling 911 or calling your MD immediately  if symptoms less severe.  You Must read complete instructions/literature along with all the possible adverse reactions/side effects for all the Medicines you take and that have been prescribed to you. Take any new Medicines after you have completely understood and accpet all the possible adverse reactions/side effects.   Do not drive, operate heavy machinery, perform activities at heights, swimming or participation in water activities or provide baby sitting services if your were admitted for syncope or siezures until you have seen by Primary MD or a Neurologist and advised to do so  again.  Do not drive when taking Pain medications.    Do not take more than prescribed Pain, Sleep and Anxiety Medications  Special Instructions: If you have smoked or chewed Tobacco  in the last 2 yrs please stop smoking, stop any regular Alcohol  and or any Recreational drug use.  Wear Seat belts while driving.   Please note  You were cared for by a hospitalist during your hospital stay. If you have any questions about your discharge medications or the care you received while you were in the hospital after you are discharged, you can call the unit and asked to speak with the hospitalist on call if the hospitalist that took care of you is not available. Once you are discharged, your primary care physician will handle any further medical issues. Please note that NO REFILLS for any discharge medications will be authorized once you are discharged, as it is imperative that you return to your primary care physician (or establish a relationship with a primary care physician if you do not have one) for your aftercare needs so that they can reassess your need for medications and monitor your lab values.     Increase activity slowly    Complete by:  As directed              Discharge Medications       Medication List    STOP taking these medications        hydrOXYzine 25 MG tablet  Commonly known as:  ATARAX/VISTARIL     predniSONE 10 MG tablet  Commonly known as:  DELTASONE      TAKE these medications        amoxicillin-clavulanate 875-125 MG tablet  Commonly known as:  AUGMENTIN  Take 1 tablet by mouth every 12 (twelve) hours.     sulfamethoxazole-trimethoprim 800-160 MG tablet  Commonly known as:  BACTRIM DS,SEPTRA DS  Take 1 tablet by mouth every 12 (twelve) hours.        Major procedures and Radiology Reports - PLEASE review detailed and final reports for all details, in brief -       Ct Pelvis W Contrast  01/25/2016  CLINICAL DATA:  Right groin pain since this  morning. Abscess in the right inguinal region. EXAM: CT PELVIS WITH CONTRAST TECHNIQUE: Multidetector CT imaging of the pelvis was performed using the standard protocol following the bolus administration of intravenous contrast. CONTRAST:  100mL ISOVUE-300 IOPAMIDOL (ISOVUE-300) INJECTION 61% COMPARISON:  None. FINDINGS: There is infiltration in the subcutaneous fat of the right groin region consistent with cellulitis. No loculated fluid collections suggesting no evidence of discrete abscess. Mild prominence of  right groin lymph nodes, likely reactive. Visualized pelvic colon and small bowel are not distended. Scattered stool in the colon. No free air or free fluid in the pelvis. Bladder wall is not thickened. Prostate gland is not enlarged. Appendix is normal. No pelvic mass or lymphadenopathy. Visualized pelvis, sacrum, and hips appear intact. IMPRESSION: Infiltration in the subcutaneous fat of the right groin region consistent with cellulitis. No discrete abscess. Local reactive lymph nodes. Electronically Signed   By: Burman Nieves M.D.   On: 01/25/2016 22:53    Micro Results      Recent Results (from the past 240 hour(s))  Blood culture (routine x 2)     Status: None (Preliminary result)   Collection Time: 01/26/16 12:50 AM  Result Value Ref Range Status   Specimen Description BLOOD LEFT ANTECUBITAL  Final   Special Requests BOTTLES DRAWN AEROBIC AND ANAEROBIC 8C EACH  Final   Culture NO GROWTH 3 DAYS  Final   Report Status PENDING  Incomplete  Blood culture (routine x 2)     Status: None (Preliminary result)   Collection Time: 01/26/16  1:05 AM  Result Value Ref Range Status   Specimen Description BLOOD LEFT HAND  Final   Special Requests BOTTLES DRAWN AEROBIC AND ANAEROBIC 8CC EACH  Final   Culture PENDING  Incomplete   Report Status PENDING  Incomplete       Today   Subjective    Brent Roman today has no headache,no chest abdominal pain,no new weakness tingling or  numbness, feels much better wants to go home today.     Objective   Blood pressure 117/71, pulse 75, temperature 97.8 F (36.6 C), temperature source Oral, resp. rate 16, height 5\' 8"  (1.727 m), weight 91.536 kg (201 lb 12.8 oz), SpO2 100 %.   Intake/Output Summary (Last 24 hours) at 01/29/16 0745 Last data filed at 01/29/16 0517  Gross per 24 hour  Intake    300 ml  Output      0 ml  Net    300 ml    Exam Awake Alert, Oriented x 3, No new F.N deficits, Normal affect [place].AT,PERRAL Supple Neck,No JVD, No cervical lymphadenopathy appriciated.  Symmetrical Chest wall movement, Good air movement bilaterally, CTAB RRR,No Gallops,Rubs or new Murmurs, No Parasternal Heave +ve B.Sounds, Abd Soft, Non tender, No organomegaly appriciated, No rebound -guarding or rigidity. No Cyanosis, Clubbing or edema, No new Rash or bruise, R groin cellulitis almost completely resolved    Data Review   CBC w Diff: Lab Results  Component Value Date   WBC 12.7* 01/28/2016   HGB 14.8 01/28/2016   HCT 43.5 01/28/2016   PLT 197 01/28/2016   LYMPHOPCT 3 01/25/2016   MONOPCT 8 01/25/2016   EOSPCT 0 01/25/2016   BASOPCT 0 01/25/2016    CMP: Lab Results  Component Value Date   NA 135 01/27/2016   K 3.7 01/27/2016   CL 101 01/27/2016   CO2 27 01/27/2016   BUN 9 01/27/2016   CREATININE 0.78 01/27/2016  .   Total Time in preparing paper work, data evaluation and todays exam - 35 minutes  Leroy Sea M.D on 01/29/2016 at 7:45 AM  [facility]   Office  [phone]

## 2016-01-31 LAB — CULTURE, BLOOD (ROUTINE X 2): CULTURE: NO GROWTH

## 2016-02-01 LAB — CULTURE, BLOOD (ROUTINE X 2): Culture: NO GROWTH

## 2017-07-13 IMAGING — CT CT PELVIS W/ CM
2 of 4 series · 17 of 46 positions shown, 19 images · IV contrast (Omnipaque 300)
Comparison: None.

CLINICAL DATA: Right groin pain since this morning. Abscess in the
right inguinal region.

EXAM:
CT PELVIS WITH CONTRAST
TECHNIQUE: Multidetector CT imaging of the pelvis was performed using the
standard protocol following the bolus administration of intravenous
contrast.
CONTRAST:  100mL 02TBN0-KYY IOPAMIDOL (02TBN0-KYY) INJECTION 61%

[Series 2: pelvis 5.0 b40f · axial · 0.80mm/px · z∈[-446,-136]mm · 14 of 72 slices shown, 16 images]
[im 5/72  soft-tissue]
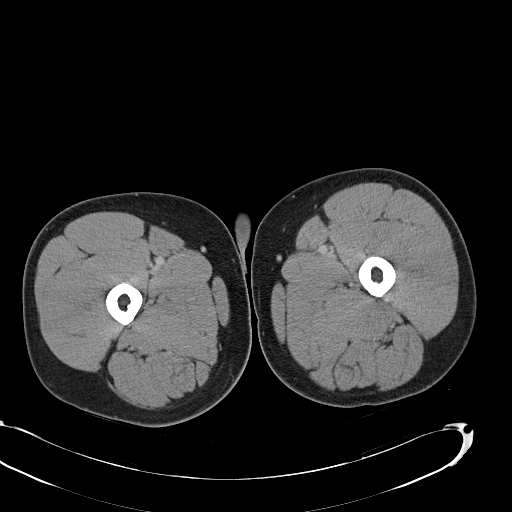
[im 5/72  bone]
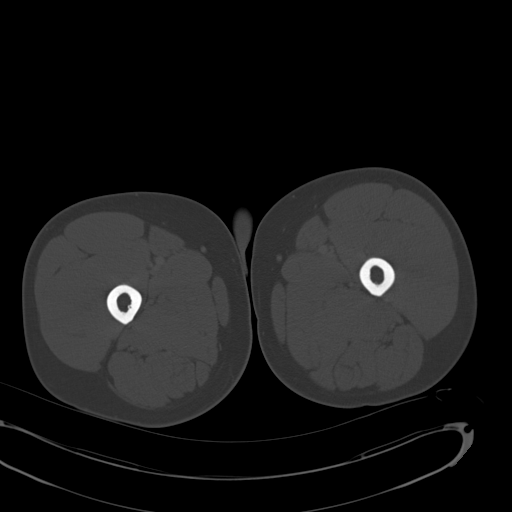
[im 10/72  soft-tissue]
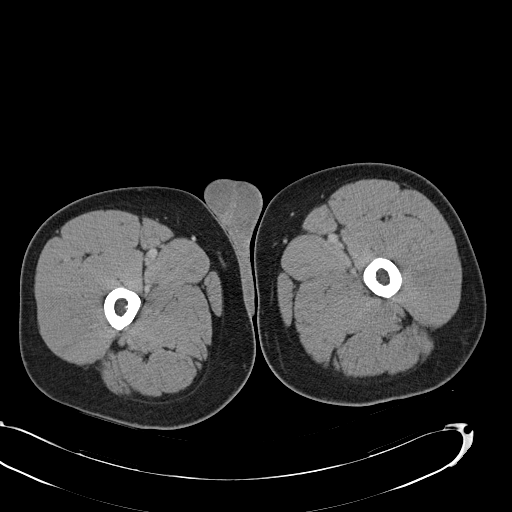
[im 15/72  soft-tissue]
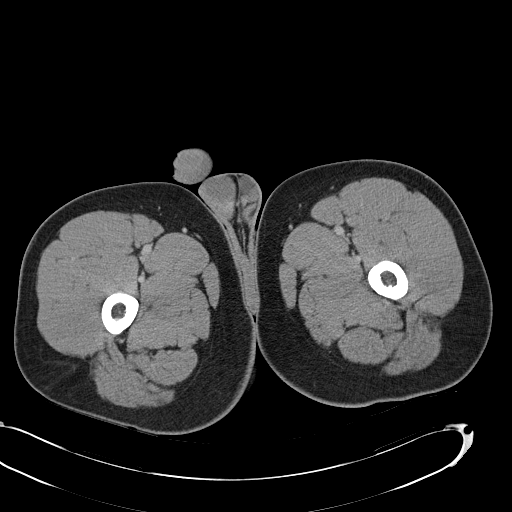
[im 19/72  soft-tissue]
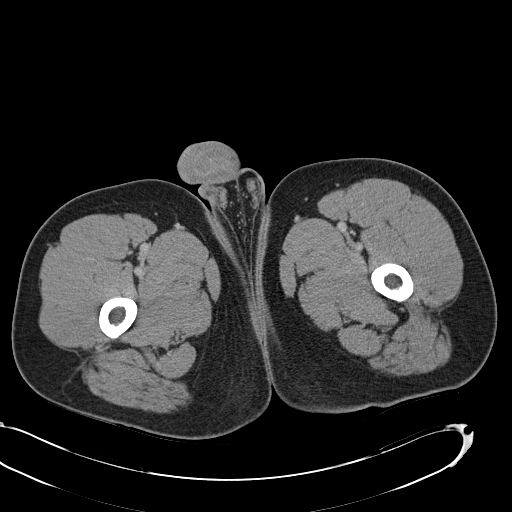
[im 24/72  soft-tissue]
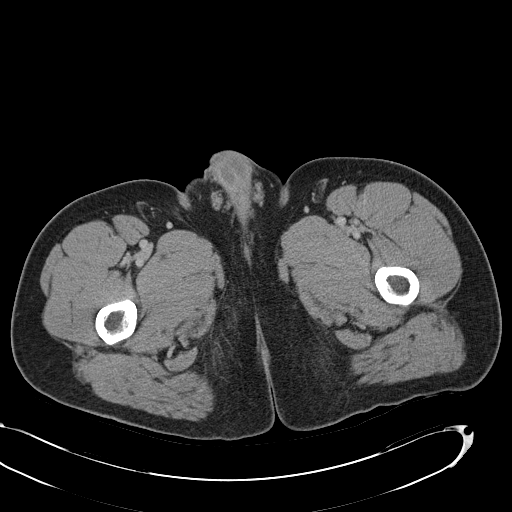
[im 29/72  soft-tissue]
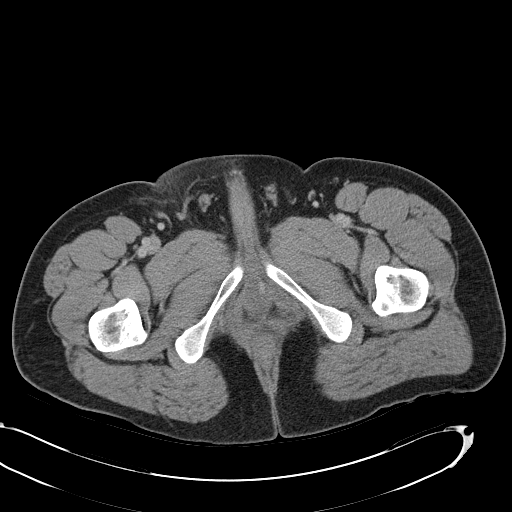
[im 34/72  soft-tissue]
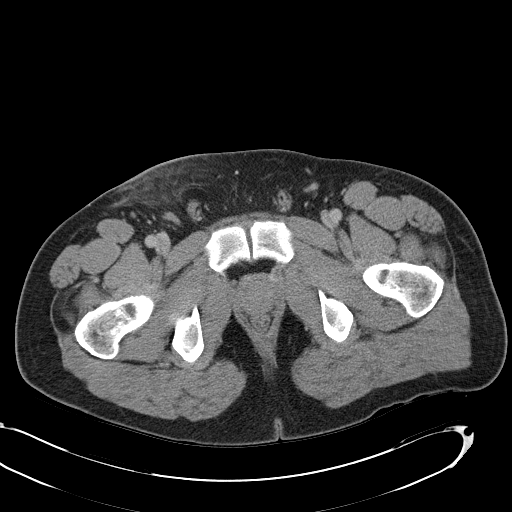
[im 38/72  soft-tissue]
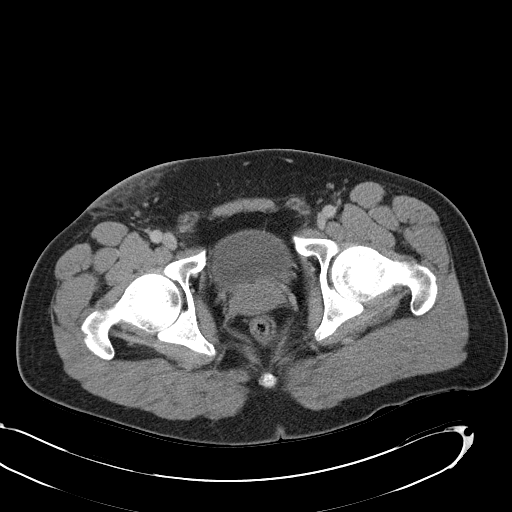
[im 43/72  soft-tissue]
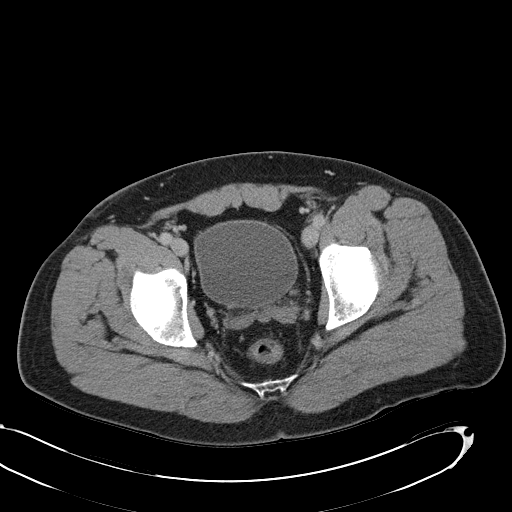
[im 43/72  bone]
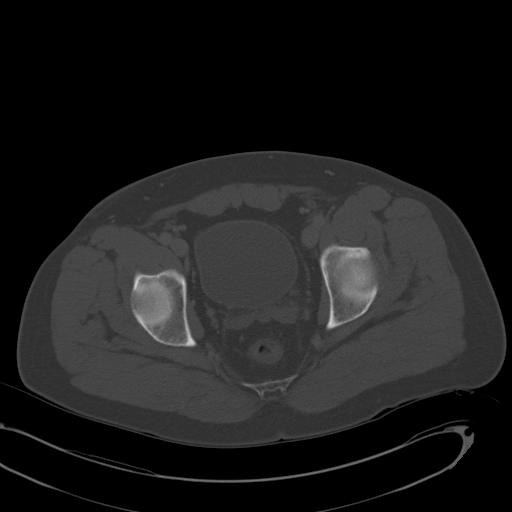
[im 48/72  soft-tissue]
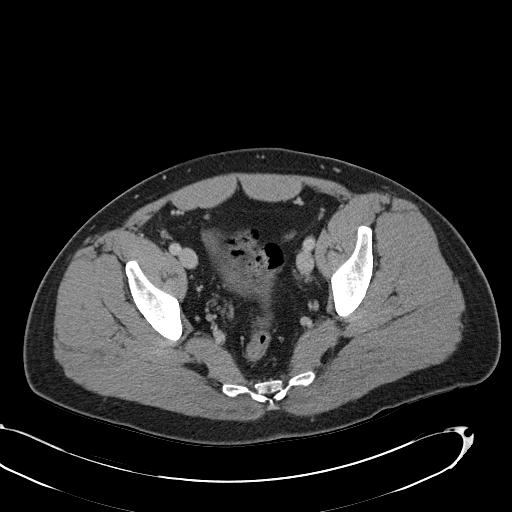
[im 53/72  soft-tissue]
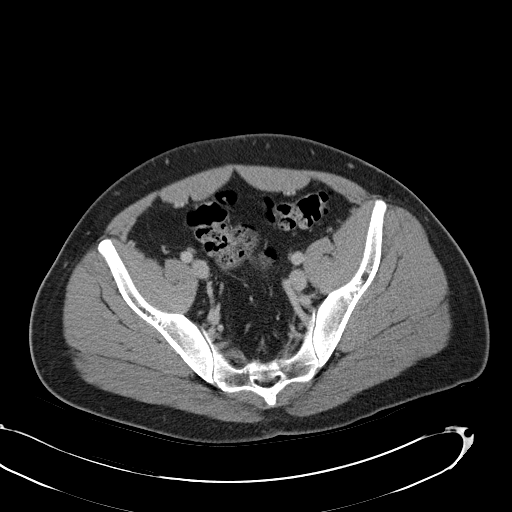
[im 57/72  soft-tissue]
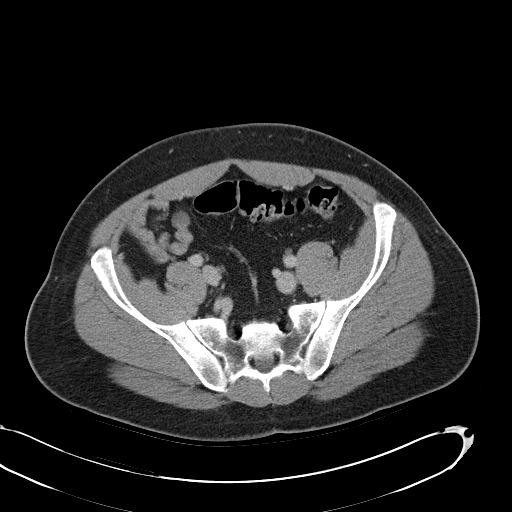
[im 62/72  soft-tissue]
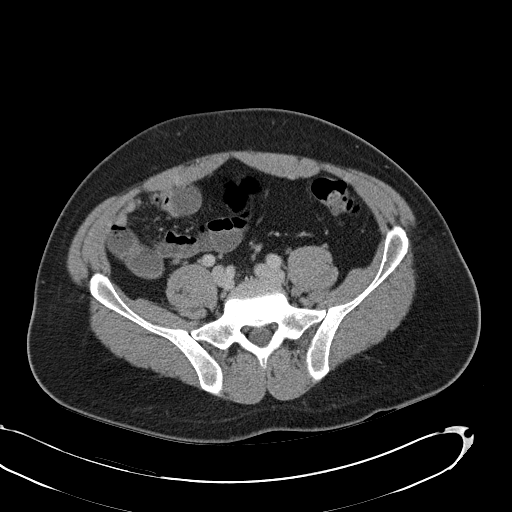
[im 67/72  soft-tissue]
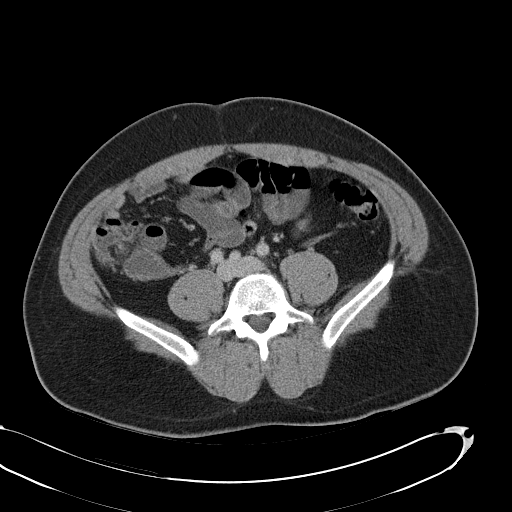

[Series 3: mpr coronal a/p · coronal · 0.72mm/px · 3 of 88 slices shown]
[im 30/88  soft-tissue]
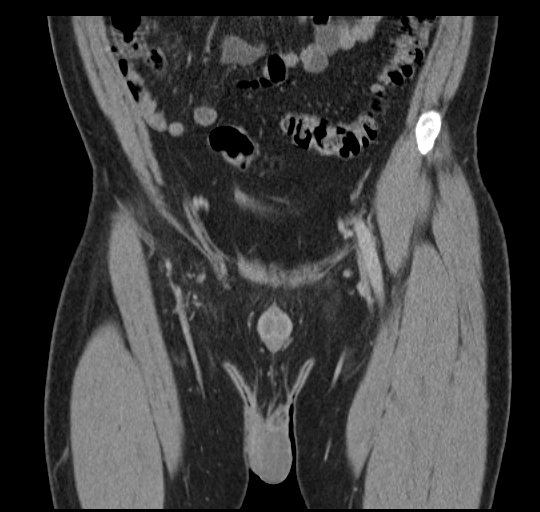
[im 39/88  soft-tissue]
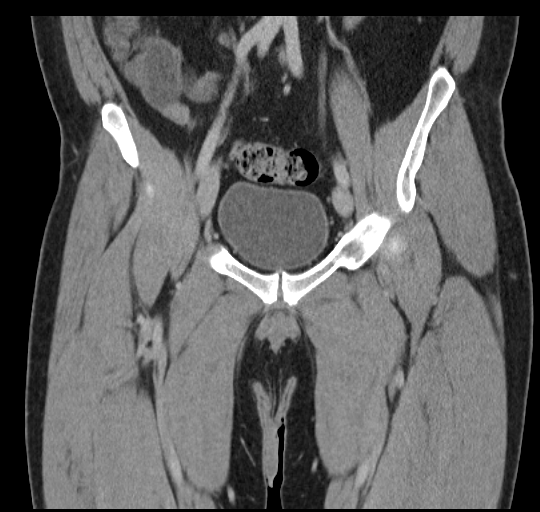
[im 49/88  soft-tissue]
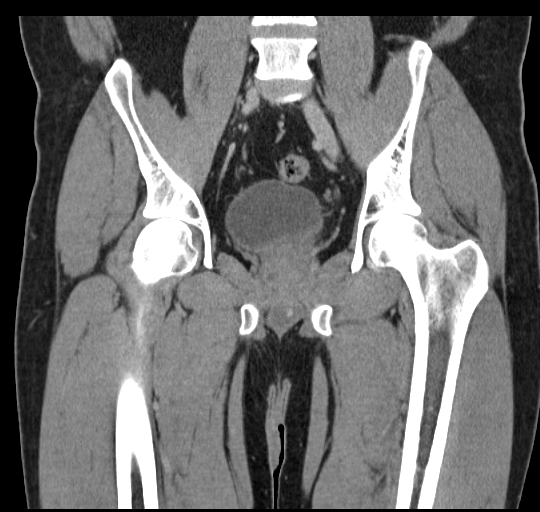

[17 of 46 positions shown; findings below may reference images not displayed]

FINDINGS: There is infiltration in the subcutaneous fat of the right groin
region consistent with cellulitis. No loculated fluid collections
suggesting no evidence of discrete abscess. Mild prominence of right
groin lymph nodes, likely reactive.

Visualized pelvic colon and small bowel are not distended. Scattered
stool in the colon. No free air or free fluid in the pelvis. Bladder
wall is not thickened. Prostate gland is not enlarged. Appendix is
normal. No pelvic mass or lymphadenopathy. Visualized pelvis,
sacrum, and hips appear intact.
IMPRESSION: Infiltration in the subcutaneous fat of the right groin region
consistent with cellulitis. No discrete abscess. Local reactive
lymph nodes.

## 2018-01-12 ENCOUNTER — Encounter (HOSPITAL_COMMUNITY)
Admission: EM | Disposition: A | Discharge status: Home / Self Care | Payer: Self-pay | Source: Home / Self Care | Attending: Emergency Medicine

## 2018-01-12 ENCOUNTER — Other Ambulatory Visit: Payer: Self-pay

## 2018-01-12 ENCOUNTER — Emergency Department (HOSPITAL_COMMUNITY): Payer: Self-pay

## 2018-01-12 ENCOUNTER — Encounter (HOSPITAL_COMMUNITY): Payer: Self-pay | Admitting: Emergency Medicine

## 2018-01-12 ENCOUNTER — Observation Stay (HOSPITAL_COMMUNITY)
Admission: EM | Admit: 2018-01-12 | Discharge: 2018-01-13 | Disposition: A | Discharge status: Home / Self Care | Payer: Self-pay | Attending: General Surgery | Admitting: General Surgery

## 2018-01-12 ENCOUNTER — Emergency Department (HOSPITAL_COMMUNITY): Payer: Self-pay | Admitting: Anesthesiology

## 2018-01-12 DIAGNOSIS — K3580 Unspecified acute appendicitis: Principal | ICD-10-CM

## 2018-01-12 DIAGNOSIS — Z87891 Personal history of nicotine dependence: Secondary | ICD-10-CM | POA: Insufficient documentation

## 2018-01-12 DIAGNOSIS — Z791 Long term (current) use of non-steroidal anti-inflammatories (NSAID): Secondary | ICD-10-CM | POA: Insufficient documentation

## 2018-01-12 HISTORY — PX: LAPAROSCOPIC APPENDECTOMY: SHX408

## 2018-01-12 LAB — COMPREHENSIVE METABOLIC PANEL
ALBUMIN: 4.5 g/dL (ref 3.5–5.0)
ALK PHOS: 41 U/L (ref 38–126)
ALT: 20 U/L (ref 17–63)
AST: 20 U/L (ref 15–41)
Anion gap: 9 (ref 5–15)
BILIRUBIN TOTAL: 0.8 mg/dL (ref 0.3–1.2)
BUN: 16 mg/dL (ref 6–20)
CALCIUM: 9.1 mg/dL (ref 8.9–10.3)
CO2: 26 mmol/L (ref 22–32)
Chloride: 101 mmol/L (ref 101–111)
Creatinine, Ser: 0.86 mg/dL (ref 0.61–1.24)
GFR calc Af Amer: >60 mL/min (ref >60)
GFR calc non Af Amer: >60 mL/min (ref >60)
GLUCOSE: 108 mg/dL — AB (ref 65–99)
Potassium: 3.5 mmol/L (ref 3.5–5.1)
Sodium: 136 mmol/L (ref 135–145)
TOTAL PROTEIN: 7.1 g/dL (ref 6.5–8.1)

## 2018-01-12 LAB — URINALYSIS, ROUTINE W REFLEX MICROSCOPIC
Bilirubin Urine: NEGATIVE
Glucose, UA: NEGATIVE mg/dL
HGB URINE DIPSTICK: NEGATIVE
KETONES UR: 80 mg/dL — AB
Leukocytes, UA: NEGATIVE
Nitrite: NEGATIVE
PROTEIN: NEGATIVE mg/dL
Specific Gravity, Urine: 1.018 (ref 1.005–1.030)
pH: 7 (ref 5.0–8.0)

## 2018-01-12 LAB — CBC
HCT: 43.1 % (ref 39.0–52.0)
Hemoglobin: 14.6 g/dL (ref 13.0–17.0)
MCH: 30.4 pg (ref 26.0–34.0)
MCHC: 33.9 g/dL (ref 30.0–36.0)
MCV: 89.6 fL (ref 78.0–100.0)
PLATELETS: 221 10*3/uL (ref 150–400)
RBC: 4.81 MIL/uL (ref 4.22–5.81)
RDW: 12.6 % (ref 11.5–15.5)
WBC: 19.6 10*3/uL — ABNORMAL HIGH (ref 4.0–10.5)

## 2018-01-12 LAB — LIPASE, BLOOD: Lipase: 35 U/L (ref 11–51)

## 2018-01-12 SURGERY — APPENDECTOMY, LAPAROSCOPIC
Anesthesia: General

## 2018-01-12 MED ORDER — DIPHENHYDRAMINE HCL 25 MG PO CAPS: 25.0000 mg | ORAL_CAPSULE | Freq: Four times a day (QID) | ORAL | Status: DC | PRN

## 2018-01-12 MED ORDER — FENTANYL CITRATE (PF) 100 MCG/2ML IJ SOLN
INTRAMUSCULAR | Status: AC
Start: 2018-01-12 — End: ?
  Filled 2018-01-12: qty 2

## 2018-01-12 MED ORDER — SUCCINYLCHOLINE 20MG/ML (10ML) SYRINGE FOR MEDFUSION PUMP - OPTIME
INTRAMUSCULAR | Status: DC | PRN
  Administered 2018-01-12: 100 mg via INTRAVENOUS

## 2018-01-12 MED ORDER — SODIUM CHLORIDE 0.9 % IR SOLN
Status: DC | PRN
  Administered 2018-01-12: 1000 mL

## 2018-01-12 MED ORDER — MIDAZOLAM HCL 2 MG/2ML IJ SOLN
INTRAMUSCULAR | Status: AC
  Filled 2018-01-12: qty 2

## 2018-01-12 MED ORDER — KETOROLAC TROMETHAMINE 30 MG/ML IJ SOLN
INTRAMUSCULAR | Status: AC
  Filled 2018-01-12: qty 1

## 2018-01-12 MED ORDER — SODIUM CHLORIDE 0.9 % IV SOLN
1.0000 g | Freq: Once | INTRAVENOUS | Status: AC
  Administered 2018-01-12: 1 g via INTRAVENOUS
  Filled 2018-01-12: qty 10

## 2018-01-12 MED ORDER — ONDANSETRON 4 MG PO TBDP: 4.0000 mg | ORAL_TABLET | Freq: Four times a day (QID) | ORAL | Status: DC | PRN

## 2018-01-12 MED ORDER — ROCURONIUM BROMIDE 100 MG/10ML IV SOLN
INTRAVENOUS | Status: DC | PRN
  Administered 2018-01-12: 10 mg via INTRAVENOUS
  Administered 2018-01-12: 20 mg via INTRAVENOUS

## 2018-01-12 MED ORDER — FENTANYL CITRATE (PF) 100 MCG/2ML IJ SOLN
INTRAMUSCULAR | Status: DC | PRN
  Administered 2018-01-12 (×2): 50 ug via INTRAVENOUS

## 2018-01-12 MED ORDER — SODIUM CHLORIDE 0.9 % IV SOLN
INTRAVENOUS | Status: DC
  Administered 2018-01-12: 18:00:00 via INTRAVENOUS

## 2018-01-12 MED ORDER — PROMETHAZINE HCL 25 MG/ML IJ SOLN: 6.2500 mg | INTRAMUSCULAR | Status: DC | PRN

## 2018-01-12 MED ORDER — ONDANSETRON HCL 4 MG/2ML IJ SOLN
INTRAMUSCULAR | Status: AC
  Filled 2018-01-12: qty 2

## 2018-01-12 MED ORDER — POVIDONE-IODINE 10 % OINT PACKET
TOPICAL_OINTMENT | CUTANEOUS | Status: DC | PRN
  Administered 2018-01-12: 1 via TOPICAL

## 2018-01-12 MED ORDER — KETOROLAC TROMETHAMINE 30 MG/ML IJ SOLN
30.0000 mg | Freq: Four times a day (QID) | INTRAMUSCULAR | Status: DC
  Administered 2018-01-12 – 2018-01-13 (×3): 30 mg via INTRAVENOUS
  Filled 2018-01-12 (×2): qty 1

## 2018-01-12 MED ORDER — ACETAMINOPHEN 325 MG PO TABS: 650.0000 mg | ORAL_TABLET | Freq: Four times a day (QID) | ORAL | Status: DC | PRN

## 2018-01-12 MED ORDER — BUPIVACAINE LIPOSOME 1.3 % IJ SUSP
INTRAMUSCULAR | Status: DC | PRN
  Administered 2018-01-12: 20 mL

## 2018-01-12 MED ORDER — SIMETHICONE 80 MG PO CHEW: 40.0000 mg | CHEWABLE_TABLET | Freq: Four times a day (QID) | ORAL | Status: DC | PRN

## 2018-01-12 MED ORDER — HYDROMORPHONE HCL 1 MG/ML IJ SOLN: 0.2500 mg | INTRAMUSCULAR | Status: DC | PRN

## 2018-01-12 MED ORDER — BUPIVACAINE LIPOSOME 1.3 % IJ SUSP
INTRAMUSCULAR | Status: AC
  Filled 2018-01-12: qty 20

## 2018-01-12 MED ORDER — POVIDONE-IODINE 10 % EX OINT
TOPICAL_OINTMENT | CUTANEOUS | Status: AC
  Filled 2018-01-12: qty 1

## 2018-01-12 MED ORDER — SUGAMMADEX SODIUM 500 MG/5ML IV SOLN
INTRAVENOUS | Status: AC
  Filled 2018-01-12: qty 5

## 2018-01-12 MED ORDER — ACETAMINOPHEN 650 MG RE SUPP
650.0000 mg | Freq: Four times a day (QID) | RECTAL | Status: DC | PRN
Start: 2018-01-12 — End: 2018-01-13
  Filled 2018-01-12: qty 1

## 2018-01-12 MED ORDER — ROCURONIUM BROMIDE 50 MG/5ML IV SOLN
INTRAVENOUS | Status: AC
  Filled 2018-01-12: qty 1

## 2018-01-12 MED ORDER — MIDAZOLAM HCL 2 MG/2ML IJ SOLN: 0.5000 mg | Freq: Once | INTRAMUSCULAR | Status: DC | PRN

## 2018-01-12 MED ORDER — ONDANSETRON HCL 4 MG/2ML IJ SOLN: 4.0000 mg | Freq: Four times a day (QID) | INTRAMUSCULAR | Status: DC | PRN

## 2018-01-12 MED ORDER — SUGAMMADEX SODIUM 200 MG/2ML IV SOLN
INTRAVENOUS | Status: DC | PRN
  Administered 2018-01-12: 400 mg via INTRAVENOUS

## 2018-01-12 MED ORDER — IOPAMIDOL (ISOVUE-300) INJECTION 61%
100.0000 mL | Freq: Once | INTRAVENOUS | Status: AC | PRN
  Administered 2018-01-12: 100 mL via INTRAVENOUS

## 2018-01-12 MED ORDER — ONDANSETRON HCL 4 MG/2ML IJ SOLN
INTRAMUSCULAR | Status: DC | PRN
  Administered 2018-01-12: 4 mg via INTRAVENOUS

## 2018-01-12 MED ORDER — DEXAMETHASONE SODIUM PHOSPHATE 4 MG/ML IJ SOLN
INTRAMUSCULAR | Status: AC
  Filled 2018-01-12: qty 1

## 2018-01-12 MED ORDER — LACTATED RINGERS IV SOLN
INTRAVENOUS | Status: DC
  Administered 2018-01-12 – 2018-01-13 (×2): via INTRAVENOUS

## 2018-01-12 MED ORDER — BACITRACIN ZINC 500 UNIT/GM EX OINT
TOPICAL_OINTMENT | CUTANEOUS | Status: AC
  Filled 2018-01-12: qty 0.9

## 2018-01-12 MED ORDER — OXYCODONE-ACETAMINOPHEN 5-325 MG PO TABS: 1.0000 | ORAL_TABLET | ORAL | Status: DC | PRN

## 2018-01-12 MED ORDER — PROPOFOL 10 MG/ML IV BOLUS
INTRAVENOUS | Status: DC | PRN
  Administered 2018-01-12: 200 mg via INTRAVENOUS

## 2018-01-12 MED ORDER — GLYCOPYRROLATE 0.2 MG/ML IJ SOLN
INTRAMUSCULAR | Status: DC | PRN
  Administered 2018-01-12: 0.2 mg via INTRAVENOUS

## 2018-01-12 MED ORDER — ACETAMINOPHEN 10 MG/ML IV SOLN: 1000.0000 mg | Freq: Once | INTRAVENOUS | Status: DC | PRN

## 2018-01-12 MED ORDER — CHLORHEXIDINE GLUCONATE CLOTH 2 % EX PADS: 6.0000 | MEDICATED_PAD | Freq: Once | CUTANEOUS | Status: DC

## 2018-01-12 MED ORDER — LACTATED RINGERS IV SOLN: INTRAVENOUS | Status: DC

## 2018-01-12 MED ORDER — ENOXAPARIN SODIUM 40 MG/0.4ML ~~LOC~~ SOLN: 40.0000 mg | SUBCUTANEOUS | Status: DC

## 2018-01-12 MED ORDER — KETOROLAC TROMETHAMINE 30 MG/ML IJ SOLN: 30.0000 mg | Freq: Four times a day (QID) | INTRAMUSCULAR | Status: DC | PRN

## 2018-01-12 MED ORDER — DIPHENHYDRAMINE HCL 50 MG/ML IJ SOLN: 25.0000 mg | Freq: Four times a day (QID) | INTRAMUSCULAR | Status: DC | PRN

## 2018-01-12 MED ORDER — HYDROMORPHONE HCL 1 MG/ML IJ SOLN
1.0000 mg | Freq: Once | INTRAMUSCULAR | Status: AC
  Administered 2018-01-12: 1 mg via INTRAVENOUS
  Filled 2018-01-12: qty 1

## 2018-01-12 MED ORDER — SODIUM CHLORIDE 0.9 % IV BOLUS
1000.0000 mL | Freq: Once | INTRAVENOUS | Status: AC
  Administered 2018-01-12: 1000 mL via INTRAVENOUS

## 2018-01-12 MED ORDER — HYDROMORPHONE HCL 1 MG/ML IJ SOLN: 1.0000 mg | INTRAMUSCULAR | Status: DC | PRN

## 2018-01-12 MED ORDER — SODIUM CHLORIDE 0.9 % IV SOLN
INTRAVENOUS | Status: DC | PRN
  Administered 2018-01-12: 16:00:00 via INTRAVENOUS

## 2018-01-12 MED ORDER — ONDANSETRON HCL 4 MG/2ML IJ SOLN
4.0000 mg | Freq: Once | INTRAMUSCULAR | Status: AC
  Administered 2018-01-12: 4 mg via INTRAVENOUS
  Filled 2018-01-12: qty 2

## 2018-01-12 MED ORDER — GLYCOPYRROLATE 0.2 MG/ML IJ SOLN
INTRAMUSCULAR | Status: AC
  Filled 2018-01-12: qty 1

## 2018-01-12 MED ORDER — MIDAZOLAM HCL 5 MG/5ML IJ SOLN
INTRAMUSCULAR | Status: DC | PRN
  Administered 2018-01-12: 2 mg via INTRAVENOUS

## 2018-01-12 MED ORDER — DEXAMETHASONE SODIUM PHOSPHATE 10 MG/ML IJ SOLN
INTRAMUSCULAR | Status: DC | PRN
  Administered 2018-01-12: 4 mg via INTRAVENOUS

## 2018-01-12 SURGICAL SUPPLY — 53 items
BAG RETRIEVAL 10 (BASKET) ×1
BAG RETRIEVAL 10MM (BASKET) ×1
CHLORAPREP W/TINT 26ML (MISCELLANEOUS) ×3 IMPLANT
CLOTH BEACON ORANGE TIMEOUT ST (SAFETY) ×3 IMPLANT
COVER LIGHT HANDLE STERIS (MISCELLANEOUS) ×6 IMPLANT
CUTTER FLEX LINEAR 45M (STAPLE) ×3 IMPLANT
DECANTER SPIKE VIAL GLASS SM (MISCELLANEOUS) ×3 IMPLANT
DRSG TEGADERM 2-3/8X2-3/4 SM (GAUZE/BANDAGES/DRESSINGS) ×9 IMPLANT
ELECT REM PT RETURN 9FT ADLT (ELECTROSURGICAL) ×3
ELECTRODE REM PT RTRN 9FT ADLT (ELECTROSURGICAL) ×1 IMPLANT
EVACUATOR SMOKE 8.L (FILTER) ×3 IMPLANT
FORMALIN 10 PREFIL 120ML (MISCELLANEOUS) ×3 IMPLANT
GLOVE BIOGEL M 6.5 STRL (GLOVE) ×3 IMPLANT
GLOVE BIOGEL PI IND STRL 6.5 (GLOVE) ×1 IMPLANT
GLOVE BIOGEL PI IND STRL 7.0 (GLOVE) ×2 IMPLANT
GLOVE BIOGEL PI IND STRL 7.5 (GLOVE) ×1 IMPLANT
GLOVE BIOGEL PI INDICATOR 6.5 (GLOVE) ×2
GLOVE BIOGEL PI INDICATOR 7.0 (GLOVE) ×4
GLOVE BIOGEL PI INDICATOR 7.5 (GLOVE) ×2
GLOVE SURG SS PI 7.5 STRL IVOR (GLOVE) ×3 IMPLANT
GOWN STRL REUS W/ TWL XL LVL3 (GOWN DISPOSABLE) ×1 IMPLANT
GOWN STRL REUS W/TWL LRG LVL3 (GOWN DISPOSABLE) ×3 IMPLANT
GOWN STRL REUS W/TWL XL LVL3 (GOWN DISPOSABLE) ×2
INST SET LAPROSCOPIC AP (KITS) ×3 IMPLANT
IV NS IRRIG 3000ML ARTHROMATIC (IV SOLUTION) IMPLANT
KIT TURNOVER KIT A (KITS) ×3 IMPLANT
MANIFOLD NEPTUNE II (INSTRUMENTS) ×3 IMPLANT
NEEDLE HYPO 18GX1.5 BLUNT FILL (NEEDLE) ×3 IMPLANT
NEEDLE HYPO 25X1 1.5 SAFETY (NEEDLE) ×3 IMPLANT
NEEDLE INSUFFLATION 14GA 120MM (NEEDLE) ×3 IMPLANT
NS IRRIG 1000ML POUR BTL (IV SOLUTION) ×3 IMPLANT
PACK LAP CHOLE LZT030E (CUSTOM PROCEDURE TRAY) ×3 IMPLANT
PAD ARMBOARD 7.5X6 YLW CONV (MISCELLANEOUS) ×3 IMPLANT
PENCIL HANDSWITCHING (ELECTRODE) ×3 IMPLANT
RELOAD 45 VASCULAR/THIN (ENDOMECHANICALS) ×3 IMPLANT
RELOAD STAPLE TA45 3.5 REG BLU (ENDOMECHANICALS) IMPLANT
SET BASIN LINEN APH (SET/KITS/TRAYS/PACK) ×3 IMPLANT
SET TUBE IRRIG SUCTION NO TIP (IRRIGATION / IRRIGATOR) IMPLANT
SHEARS HARMONIC ACE PLUS 36CM (ENDOMECHANICALS) ×3 IMPLANT
SPONGE GAUZE 2X2 8PLY STER LF (GAUZE/BANDAGES/DRESSINGS) ×3
SPONGE GAUZE 2X2 8PLY STRL LF (GAUZE/BANDAGES/DRESSINGS) ×6 IMPLANT
STAPLER VISISTAT (STAPLE) ×3 IMPLANT
SUT VICRYL 0 UR6 27IN ABS (SUTURE) ×3 IMPLANT
SYR 20CC LL (SYRINGE) ×6 IMPLANT
SYS BAG RETRIEVAL 10MM (BASKET) ×1
SYSTEM BAG RETRIEVAL 10MM (BASKET) ×1 IMPLANT
TRAY FOLEY CATH SILVER 16FR (SET/KITS/TRAYS/PACK) ×3 IMPLANT
TROCAR ENDO BLADELESS 11MM (ENDOMECHANICALS) ×3 IMPLANT
TROCAR ENDO BLADELESS 12MM (ENDOMECHANICALS) ×3 IMPLANT
TROCAR XCEL NON-BLD 5MMX100MML (ENDOMECHANICALS) ×3 IMPLANT
TUBING INSUFFLATION (TUBING) ×3 IMPLANT
WARMER LAPAROSCOPE (MISCELLANEOUS) ×3 IMPLANT
YANKAUER SUCT 12FT TUBE ARGYLE (SUCTIONS) ×3 IMPLANT

## 2018-01-12 NOTE — Transfer of Care (Signed)
Immediate Anesthesia Transfer of Care Note  Patient: Brent Roman  Procedure(s) Performed: APPENDECTOMY LAPAROSCOPIC (N/A ) Location Patient : PACU  Anesthesia Type:General  Level of Consciousness: awake, alert  and oriented  Airway & Oxygen Therapy: Patient Spontanous Breathing  Post-op Assessment: Report given to RN  Post vital signs: stable  Last Vitals:  Vitals Value Taken Time  BP 122/75 01/12/2018  7:30 PM  Temp    Pulse 97 01/12/2018  7:31 PM  Resp 19 01/12/2018  7:32 PM  SpO2 100 % 01/12/2018  7:31 PM  Vitals shown include unvalidated device data.  Last Pain:  Vitals:   01/12/18 1747  TempSrc: Oral  PainSc:          Complications: No apparent anesthesia complications

## 2018-01-12 NOTE — ED Notes (Signed)
Anesthesia in to speak w pt

## 2018-01-12 NOTE — H&P (Signed)
Brent Roman is an 32 y.o. male.   Chief Complaint: Right lower quadrant abdominal pain HPI: Patient is a 32 year old Hispanic male who presents the emergency room with worsening right lower quadrant abdominal pain.  CT scan of the abdomen revealed acute appendicitis with possible microperforation.  Patient has a decreased appetite.  History reviewed. No pertinent past medical history.  History reviewed. No pertinent surgical history.  History reviewed. No pertinent family history. Social History:  reports that he quit smoking about 2 years ago. His smoking use included cigarettes. He smoked 0.50 packs per day. He has never used smokeless tobacco. He reports that he drank alcohol. He reports that he does not use drugs.  Allergies: No Known Allergies   (Not in a hospital admission)  Results for orders placed or performed during the hospital encounter of 01/12/18 (from the past 48 hour(s))  Urinalysis, Routine w reflex microscopic     Status: Abnormal   Collection Time: 01/12/18  3:01 PM  Result Value Ref Range   Color, Urine YELLOW YELLOW   APPearance CLEAR CLEAR   Specific Gravity, Urine 1.018 1.005 - 1.030   pH 7.0 5.0 - 8.0   Glucose, UA NEGATIVE NEGATIVE mg/dL   Hgb urine dipstick NEGATIVE NEGATIVE   Bilirubin Urine NEGATIVE NEGATIVE   Ketones, ur 80 (A) NEGATIVE mg/dL   Protein, ur NEGATIVE NEGATIVE mg/dL   Nitrite NEGATIVE NEGATIVE   Leukocytes, UA NEGATIVE NEGATIVE    Comment: Performed at [facility], [address], [place], [place] [address]  Lipase, blood     Status: None   Collection Time: 01/12/18  3:54 PM  Result Value Ref Range   Lipase 35 11 - 51 U/L    Comment: Performed at [facility], [address], [place], [place] [address]  Comprehensive metabolic panel     Status: Abnormal   Collection Time: 01/12/18  3:54 PM  Result Value Ref Range   Sodium 136 135 - 145 mmol/L   Potassium 3.5 3.5 - 5.1 mmol/L   Chloride 101 101 - 111 mmol/L   CO2 26  22 - 32 mmol/L   Glucose, Bld 108 (H) 65 - 99 mg/dL   BUN 16 6 - 20 mg/dL   Creatinine, Ser 0.86 0.61 - 1.24 mg/dL   Calcium 9.1 8.9 - 10.3 mg/dL   Total Protein 7.1 6.5 - 8.1 g/dL   Albumin 4.5 3.5 - 5.0 g/dL   AST 20 15 - 41 U/L   ALT 20 17 - 63 U/L   Alkaline Phosphatase 41 38 - 126 U/L   Total Bilirubin 0.8 0.3 - 1.2 mg/dL   GFR calc non Af Amer >60 >60 mL/min   GFR calc Af Amer >60 >60 mL/min    Comment: (NOTE) The eGFR has been calculated using the CKD EPI equation. This calculation has not been validated in all clinical situations. eGFR's persistently <60 mL/min signify possible Chronic Kidney Disease.    Anion gap 9 5 - 15    Comment: Performed at [facility], [address], [place], [place] [address]  CBC     Status: Abnormal   Collection Time: 01/12/18  3:54 PM  Result Value Ref Range   WBC 19.6 (H) 4.0 - 10.5 K/uL   RBC 4.81 4.22 - 5.81 MIL/uL   Hemoglobin 14.6 13.0 - 17.0 g/dL   HCT 43.1 39.0 - 52.0 %   MCV 89.6 78.0 - 100.0 fL   MCH 30.4 26.0 - 34.0 pg   MCHC  33.9 30.0 - 36.0 g/dL   RDW 12.6 11.5 - 15.5 %   Platelets 221 150 - 400 K/uL    Comment: Performed at [facility], [address], [place], [place] [address]   Ct Abdomen Pelvis W Contrast  Addendum Date: 01/12/2018   ADDENDUM REPORT: 01/12/2018 16:57 ADDENDUM: These results were called by telephone at the time of interpretation on 01/12/2018 at 4:52 pm to Dr. Fredia Sorrow , who verbally acknowledged these results. Electronically Signed   By: Fidela Salisbury M.D.   On: 01/12/2018 16:57   Result Date: 01/12/2018 CLINICAL DATA:  Periumbilical abdominal pain. EXAM: CT ABDOMEN AND PELVIS WITH CONTRAST TECHNIQUE: Multidetector CT imaging of the abdomen and pelvis was performed using the standard protocol following bolus administration of intravenous contrast. CONTRAST:  127m ISOVUE-300 IOPAMIDOL (ISOVUE-300) INJECTION 61% COMPARISON:  None. FINDINGS: Lower chest: Focal subpleural thickening in the right  posterolateral lung base, likely posttraumatic as there is slight deformity of the overlying rib. Hepatobiliary: No focal liver abnormality is seen. No gallstones, gallbladder wall thickening, or biliary dilatation. Pancreas: Unremarkable. No pancreatic ductal dilatation or surrounding inflammatory changes. Spleen: Normal in size without focal abnormality. Adrenals/Urinary Tract: Adrenal glands are unremarkable. Kidneys are normal, without renal calculi, focal lesion, or hydronephrosis. Bladder is unremarkable. Stomach/Bowel: Normal stomach, small bowel and colon. Appendicoliths at the base of the distended fluid-filled appendix which measures 14 mm transversely. Small amount of free periappendiceal fluid. Associated mesenteric stranding in the right lower quadrant of the abdomen. Vascular/Lymphatic: No significant vascular findings are present. No enlarged abdominal or pelvic lymph nodes. Reproductive: Prostate is unremarkable. Other: No abdominal wall hernia or abnormality. No abdominopelvic ascites. Musculoskeletal: No acute or significant osseous findings. IMPRESSION: Acute appendicitis with appendicolith at the base of the appendix. Small amount of periventricular fluid with which micro rupture cannot be excluded. No evidence of abscess formation. Electronically Signed: By: DFidela SalisburyM.D. On: 01/12/2018 16:49    Review of Systems  Constitutional: Positive for malaise/fatigue.  HENT: Negative.   Eyes: Negative.   Respiratory: Negative.   Cardiovascular: Negative.   Gastrointestinal: Positive for abdominal pain.  Genitourinary: Negative.   Musculoskeletal: Negative.   Skin: Negative.   Neurological: Negative.   Endo/Heme/Allergies: Negative.   Psychiatric/Behavioral: Negative.     Blood pressure 122/82, pulse 89, temperature 98.1 F (36.7 C), temperature source Oral, resp. rate 18, height '5\' 6"'  (1.676 m), weight 182 lb (82.6 kg), SpO2 100 %. Physical Exam  Vitals  reviewed. Constitutional: He is oriented to person, place, and time. He appears well-developed and well-nourished.  HENT:  Head: Normocephalic and atraumatic.  Cardiovascular: Normal rate, regular rhythm and normal heart sounds. Exam reveals no gallop and no friction rub.  No murmur heard. Respiratory: Effort normal and breath sounds normal. No respiratory distress. He has no wheezes. He has no rales.  GI: Soft. Bowel sounds are normal. He exhibits no distension. There is tenderness. There is guarding. There is no rebound.  Tender in the right lower quadrant to deep palpation.  No rigidity noted.  Neurological: He is alert and oriented to person, place, and time.  Skin: Skin is warm and dry.    CT scan reviewed Assessment/Plan Impression: Acute appendicitis Plan: Patient will be taken to the operating room for laparoscopic appendectomy.  Risks and benefits of the procedure were fully explained to the patient through an interpreter, who gave informed consent.  MAviva Signs MD 01/12/2018, 6:21 PM

## 2018-01-12 NOTE — ED Provider Notes (Addendum)
[facility] EMERGENCY DEPARTMENT Provider Note   CSN: [id number] Arrival date & time: 01/12/18  1439     History   Chief Complaint Chief Complaint  Patient presents with  . Abdominal Pain    HPI Brent Roman is a 32 y.o. male.  Language interpreter used together initial information.  Patient with complaint of periumbilical abdominal pain upon awakening this morning.  States he felt fine yesterday.  Associated with some nausea and vomiting.  No diarrhea.  Past medical history noncontributory no prior surgical history.  Patient states he still has his appendix.     History reviewed. No pertinent past medical history.  Patient Active Problem List   Diagnosis Date Noted  . Cellulitis of left groin 01/27/2016  . Cellulitis 01/26/2016  . Cellulitis of right groin     History reviewed. No pertinent surgical history.      Home Medications    Prior to Admission medications   Medication Sig Start Date End Date Taking? Authorizing Provider  ibuprofen (ADVIL,MOTRIN) 400 MG tablet Take 400 mg by mouth every 6 (six) hours as needed.   Yes [provider]    Family History History reviewed. No pertinent family history.  Social History Social History   Tobacco Use  . Smoking status: Former Smoker    Packs/day: 0.50    Types: Cigarettes    Last attempt to quit: 09/11/2015    Years since quitting: 2.3  . Smokeless tobacco: Never Used  Substance Use Topics  . Alcohol use: Not Currently    Frequency: Never  . Drug use: No     Allergies   Patient has no known allergies.   Review of Systems Review of Systems  Constitutional: Negative for fever.  HENT: Negative for congestion.   Eyes: Negative for redness.  Respiratory: Negative for shortness of breath.   Cardiovascular: Negative for chest pain.  Gastrointestinal: Positive for abdominal pain, nausea and vomiting.  Genitourinary: Negative for dysuria.  Skin: Negative for rash.    Neurological: Negative for headaches.  Hematological: Does not bruise/bleed easily.  Psychiatric/Behavioral: Negative for confusion.     Physical Exam Updated Vital Signs BP 119/75 (BP Location: Right Arm)   Pulse 70   Temp 97.9 F (36.6 C) (Oral)   Resp 18   Ht 1.676 m ( )   Wt 82.6 kg (182 lb)   SpO2 100%   BMI 29.38 kg/m   Physical Exam  Constitutional: He is oriented to person, place, and time. He appears well-developed and well-nourished. No distress.  HENT:  Head: Normocephalic and atraumatic.  Mouth/Throat: Oropharynx is clear and moist.  Eyes: Pupils are equal, round, and reactive to light. Conjunctivae and EOM are normal.  Neck: Neck supple.  Cardiovascular: Normal rate, regular rhythm and normal heart sounds.  Pulmonary/Chest: Effort normal and breath sounds normal. No respiratory distress.  Abdominal: Soft. Bowel sounds are normal. There is tenderness. There is no guarding.  Mild tenderness to palpation to periumbilical area no guarding.  No distinct right lower quadrant tenderness on initial exam.  Musculoskeletal: Normal range of motion.  Neurological: He is alert and oriented to person, place, and time. No cranial nerve deficit or sensory deficit. He exhibits normal muscle tone. Coordination normal.  Skin: Skin is warm.  Nursing note and vitals reviewed.    ED Treatments / Results  Labs (all labs ordered are listed, but only abnormal results are displayed) Labs Reviewed  COMPREHENSIVE METABOLIC PANEL - Abnormal; Notable for the following components:  Result Value   Glucose, Bld 108 (*)    All other components within normal limits  CBC - Abnormal; Notable for the following components:   WBC 19.6 (*)    All other components within normal limits  URINALYSIS, ROUTINE W REFLEX MICROSCOPIC - Abnormal; Notable for the following components:   Ketones, ur 80 (*)    All other components within normal limits  LIPASE, BLOOD     EKG None  Radiology Ct Abdomen Pelvis W Contrast  Addendum Date: 01/12/2018   ADDENDUM REPORT: 01/12/2018 16:57 ADDENDUM: These results were called by telephone at the time of interpretation on 01/12/2018 at 4:52 pm to Dr. Vanetta Mulders , who verbally acknowledged these results. Electronically Signed   By: Ted Mcalpine M.D.   On: 01/12/2018 16:57   Result Date: 01/12/2018 CLINICAL DATA:  Periumbilical abdominal pain. EXAM: CT ABDOMEN AND PELVIS WITH CONTRAST TECHNIQUE: Multidetector CT imaging of the abdomen and pelvis was performed using the standard protocol following bolus administration of intravenous contrast. CONTRAST:  ISOVUE-300 IOPAMIDOL (ISOVUE-300) INJECTION 61% COMPARISON:  None. FINDINGS: Lower chest: Focal subpleural thickening in the right posterolateral lung base, likely posttraumatic as there is slight deformity of the overlying rib. Hepatobiliary: No focal liver abnormality is seen. No gallstones, gallbladder wall thickening, or biliary dilatation. Pancreas: Unremarkable. No pancreatic ductal dilatation or surrounding inflammatory changes. Spleen: Normal in size without focal abnormality. Adrenals/Urinary Tract: Adrenal glands are unremarkable. Kidneys are normal, without renal calculi, focal lesion, or hydronephrosis. Bladder is unremarkable. Stomach/Bowel: Normal stomach, small bowel and colon. Appendicoliths at the base of the distended fluid-filled appendix which measures 14 mm transversely. Small amount of free periappendiceal fluid. Associated mesenteric stranding in the right lower quadrant of the abdomen. Vascular/Lymphatic: No significant vascular findings are present. No enlarged abdominal or pelvic lymph nodes. Reproductive: Prostate is unremarkable. Other: No abdominal wall hernia or abnormality. No abdominopelvic ascites. Musculoskeletal: No acute or significant osseous findings. IMPRESSION: Acute appendicitis with appendicolith at the base of the appendix.  Small amount of periventricular fluid with which micro rupture cannot be excluded. No evidence of abscess formation. Electronically Signed: By: Ted Mcalpine M.D. On: 01/12/2018 16:49    Procedures Procedures (including critical care time)  Medications Ordered in ED Medications  0.9 %  sodium chloride infusion (has no administration in time range)  HYDROmorphone (DILAUDID) injection 1 mg (has no administration in time range)  cefTRIAXone (ROCEPHIN) 1 g in sodium chloride 0.9 % 100 mL IVPB (has no administration in time range)  ondansetron (ZOFRAN) injection 4 mg (4 mg Intravenous Given 01/12/18 1550)  sodium chloride 0.9 % bolus 1,000 mL (0 mLs Intravenous Stopped 01/12/18 1725)  HYDROmorphone (DILAUDID) injection 1 mg (1 mg Intravenous Given 01/12/18 1552)  iopamidol (ISOVUE-300) 61 % injection 100 mL (100 mLs Intravenous Contrast Given 01/12/18 1609)     Initial Impression / Assessment and Plan / ED Course  I have reviewed the triage vital signs and the nursing notes.  Pertinent labs & imaging results that were available during my care of the patient were reviewed by me and considered in my medical decision making (see chart for details).     CT scan consistent with acute appendicitis discussed with Dr. Franky Macho on-call for general surgery.  He will take the patient to the operating room.  He recommended 1 g of Rocephin which has been ordered.  Ab significant for marked leukocytosis otherwise no significant abnormalities.  Final Clinical Impressions(s) / ED Diagnoses   Final diagnoses:  Acute appendicitis, unspecified acute appendicitis type    ED Discharge Orders    None       Vanetta Mulders, MD 01/12/18 1734    Vanetta Mulders, MD 01/12/18 [phone]

## 2018-01-12 NOTE — ED Notes (Signed)
Dr. Jenkins in to see pt. 

## 2018-01-12 NOTE — ED Triage Notes (Addendum)
Language Line used -spanish Arna Medici [phone]).  Patient c/o abd pain in umbilical region that started this morning when he woke. Per patient nausea and vomiting. Denies any diarrhea, fevers, or urinary symptoms. Per patient had [place] food yesterday in which he thinks it's related. Per patient x2 BMs today-normal, no blood noted.  Per patient still has appendix.

## 2018-01-12 NOTE — Anesthesia Preprocedure Evaluation (Signed)
Anesthesia Evaluation  Patient identified by MRN, date of birth, ID band Patient awake    Reviewed: Allergy & Precautions, H&P , NPO status , Patient's Chart, lab work & pertinent test results, reviewed documented beta blocker date and time   Airway Mallampati: II  TM Distance: >3 FB Neck ROM: full    Dental no notable dental hx. (+) Teeth Intact   Pulmonary neg pulmonary ROS, former smoker,    Pulmonary exam normal breath sounds clear to auscultation       Cardiovascular Exercise Tolerance: Good negative cardio ROS   Rhythm:regular Rate:Normal     Neuro/Psych negative neurological ROS  negative psych ROS   GI/Hepatic negative GI ROS, Neg liver ROS,   Endo/Other  negative endocrine ROS  Renal/GU negative Renal ROS  negative genitourinary   Musculoskeletal   Abdominal   Peds  Hematology negative hematology ROS (+)   Anesthesia Other Findings Spoke to patient (english was good) and his brother. No clinical complaints No reported medical issues  Reproductive/Obstetrics negative OB ROS                             Anesthesia Physical Anesthesia Plan  ASA: II and emergent  Anesthesia Plan: General   Post-op Pain Management:    Induction:   PONV Risk Score and Plan:   Airway Management Planned:   Additional Equipment:   Intra-op Plan:   Post-operative Plan:   Informed Consent: I have reviewed the patients History and Physical, chart, labs and discussed the procedure including the risks, benefits and alternatives for the proposed anesthesia with the patient or authorized representative who has indicated his/her understanding and acceptance.   Dental Advisory Given  Plan Discussed with: CRNA  Anesthesia Plan Comments:         Anesthesia Quick Evaluation

## 2018-01-12 NOTE — Progress Notes (Signed)
Pt arrived to 324 via stretcher from PACU by RN. Oriented to room and equipment. PAS and skin swarm completed with Silva Bandy, Charity fundraiser. Three lap sites noted to abd. No other skin issues noted. C/o 8/10 abd pain. Will check for available meds. Family at bedside.

## 2018-01-12 NOTE — ED Notes (Signed)
Used spanish interpreter to assist pt with signing consent form

## 2018-01-12 NOTE — Anesthesia Procedure Notes (Signed)
Procedure Name: Intubation Performed by: Allena Earing, MD Pre-anesthesia Checklist: Patient identified, Emergency Drugs available, Suction available, Patient being monitored and Timeout performed Patient Re-evaluated:Patient Re-evaluated prior to induction Oxygen Delivery Method: Circle system utilized Preoxygenation: Pre-oxygenation with 100% oxygen Induction Type: IV induction, Cricoid Pressure applied and Rapid sequence Laryngoscope Size: Miller and 4 Grade View: Grade I Tube type: Oral Laser Tube: Cuffed inflated with minimal occlusive pressure - saline Tube size: 7.5 mm Number of attempts: 1 Airway Equipment and Method: Stylet Placement Confirmation: positive ETCO2 and CO2 detector Secured at: 22 cm Tube secured with: Tape

## 2018-01-12 NOTE — Op Note (Signed)
Patient:  Brent Roman  DOB:  08-01-1986  MRN:  [id number]   Preop Diagnosis: Acute appendicitis  Postop Diagnosis: Same  Procedure: Laparoscopic appendectomy  Surgeon: Franky Macho, MD  Anes: General endotracheal  Indications: Patient is a 32 year old Hispanic male who presents with right lower quadrant abdominal pain.  CT scan of the abdomen reveals acute appendicitis.  The risks and benefits of the procedure including bleeding, infection, and the possibility of an open procedure were fully explained to the patient, who gave informed consent.  Procedure note: The patient was placed in supine position.  After induction of general endotracheal anesthesia, the abdomen was prepped and draped using the usual sterile technique with DuraPrep.  Surgical site confirmation was performed.  The supraumbilical incision was made down to the fascia.  A Veress needle was introduced into the abdominal cavity and confirmation of placement was done using the saline drop test.  The abdomen was then insufflated to 16 mmHg pressure.  An 11 mm trocar was introduced into the abdominal cavity under direct visualization without difficulty.  The patient was placed in deeper Trendelenburg position and an additional 12 mm trocar was placed in suprapubic region and a 5 mm trocar was placed left lower quadrant region.  The appendix was visualized and its distal half was noted to be inflamed.  There was no evidence of perforation.  The mesoappendix was divided using the harmonic scalpel.  The juncture of the appendix with the cecum was fully visualized.  A vascular Endo GIA was placed across the base of the appendix and fired.  The appendix was then removed using an Endo Catch bag without difficulty.  The staple line was inspected and noted to be within normal limits.  All fluid and air were then evacuated from the abdominal cavity prior to the removal of the trochars.  All wounds were irrigated with normal  saline.  All wounds were injected with Exparel.  The supraumbilical fascia as well as suprapubic fascia were reapproximated using 0 Vicryl interrupted sutures.  All skin incisions were closed using staples.  Betadine ointment and dry sterile dressings were applied.  All tape and needle counts were correct at the end of the procedure.  Patient was extubated in the operating room and transferred to PACU in stable condition.    Complications: None  EBL: Minimal  Specimen: Appendix

## 2018-01-12 NOTE — Anesthesia Postprocedure Evaluation (Signed)
Anesthesia Post Note  Patient: Kayvan Hoefling  Procedure(s) Performed: APPENDECTOMY LAPAROSCOPIC (N/A )  Patient location during evaluation: PACU Anesthesia Type: General Level of consciousness: awake and alert Pain management: pain level controlled Vital Signs Assessment: post-procedure vital signs reviewed and stable Respiratory status: spontaneous breathing, nonlabored ventilation, respiratory function stable and patient connected to nasal cannula oxygen Cardiovascular status: blood pressure returned to baseline and stable Postop Assessment: no apparent nausea or vomiting Anesthetic complications: no     Last Vitals:  Vitals:   01/12/18 1747 01/12/18 1928  BP: 122/82 122/86  Pulse: 89 (!) 109  Resp: 18 16  Temp: 36.7 C 36.9 C  SpO2: 100%     Last Pain:  Vitals:   01/12/18 1747  TempSrc: Oral  PainSc:                  Allena Earing

## 2018-01-12 NOTE — ED Notes (Signed)
Pt taken to OR by staff.

## 2018-01-13 ENCOUNTER — Other Ambulatory Visit: Payer: Self-pay

## 2018-01-13 ENCOUNTER — Encounter (HOSPITAL_COMMUNITY): Payer: Self-pay

## 2018-01-13 LAB — CBC
HCT: 39.4 % (ref 39.0–52.0)
HEMOGLOBIN: 13 g/dL (ref 13.0–17.0)
MCH: 30 pg (ref 26.0–34.0)
MCHC: 33 g/dL (ref 30.0–36.0)
MCV: 90.8 fL (ref 78.0–100.0)
Platelets: 203 10*3/uL (ref 150–400)
RBC: 4.34 MIL/uL (ref 4.22–5.81)
RDW: 12.8 % (ref 11.5–15.5)
WBC: 12.4 10*3/uL — ABNORMAL HIGH (ref 4.0–10.5)

## 2018-01-13 LAB — BASIC METABOLIC PANEL
ANION GAP: 7 (ref 5–15)
BUN: 10 mg/dL (ref 6–20)
CHLORIDE: 104 mmol/L (ref 101–111)
CO2: 27 mmol/L (ref 22–32)
Calcium: 8.4 mg/dL — ABNORMAL LOW (ref 8.9–10.3)
Creatinine, Ser: 0.72 mg/dL (ref 0.61–1.24)
GFR calc Af Amer: >60 mL/min (ref >60)
GFR calc non Af Amer: >60 mL/min (ref >60)
GLUCOSE: 107 mg/dL — AB (ref 65–99)
POTASSIUM: 3.7 mmol/L (ref 3.5–5.1)
SODIUM: 138 mmol/L (ref 135–145)

## 2018-01-13 MED ORDER — HYDROCODONE-ACETAMINOPHEN 5-325 MG PO TABS: 1.0000 | ORAL_TABLET | ORAL | 0 refills | Status: AC | PRN

## 2018-01-13 NOTE — Discharge Instructions (Signed)
Apendicectoma laparoscpica en adultos, cuidados posteriores Laparoscopic Appendectomy, Adult, Care After Siga estas instrucciones durante las prximas semanas. Estas indicaciones le proporcionan informacin acerca de cmo deber cuidarse despus del procedimiento. Su mdico tambin podr darle instrucciones ms especficas. El tratamiento ha sido planificado segn las prcticas mdicas actuales, pero en algunos casos pueden ocurrir problemas. Comunquese con el mdico si tiene algn problema o dudas despus del procedimiento. Qu puedo esperar despus del procedimiento? Despus del procedimiento, es comn [employer] siguientes sntomas:  Disminucin en el nivel de Brent Roman.  Dolor leve en la zona donde se realizaron los cortes quirrgicos (incisiones).  Estreimiento. Puede ser a causa de los analgsicos y la disminucin de sus [place].  Siga estas instrucciones en su casa: [employer] de venta libre y los recetados solamente como se lo haya indicado el mdico.  No conduzca durante 24horas si le administraron un sedante.  No conduzca ni opere maquinaria pesada mientras toma analgsicos recetados.  Si le recetaron un antibitico, tmelo como se lo haya indicado el mdico. No deje de tomar los antibiticos aunque comience a Actor. Actividad  Siga las siguientes indicaciones durante 3 semanas o el tiempo que le haya indicado el mdico: ? No levante ningn objeto que pese ms de 10libras (4,5 kg). ? No practique deportes de contacto.  Retome gradualmente sus actividades normales. Pregntele al mdico qu actividades son seguras para usted. El bao  Mantenga las incisiones limpias y secas. Lmpielas como se lo haya indicado el mdico: ? Lave las incisiones suavemente con agua y [place]. ? Enjuguelas con agua para quitar todo el jabn. ? Squelas bien con una toalla limpia dando golpecitos. No frote sobre las incisiones.  Puede tomar una ducha  despus de 48horas.  No tome baos de inmersin, no practique natacin ni use el jacuzzi durante 2semanas o segn las indicaciones del mdico. Cuidados de la herida  Siga las indicaciones del mdico acerca del cuidado de las incisiones. Haga lo siguiente: ? Lvese las manos con agua y jabn antes de Multimedia programmer las vendas (vendaje). Use desinfectante para manos si no dispone de [place] y [place]. ? Cambie el vendaje como se lo haya indicado el mdico. ? No retire los puntos (suturas), el [employer] para la piel o las tiras [place]. Es posible que estos deban quedar puestos en la piel durante 2semanas o ms tiempo. Si los bordes de las tiras [address] empiezan a despegarse y Scientific laboratory technician, puede recortar los que estn sueltos. No retire las tiras [employer] por completo a menos que el mdico se lo indique.  Controle todos los [employer] zonas de las incisiones para detectar signos de infeccin. Est atento a los siguientes signos: ? Aumento del enrojecimiento, de la hinchazn o del dolor. ? Ms lquido [place]. ? [place]. ? Pus o mal olor. Otras indicaciones  Si lo enviaron de regreso a su casa con un drenaje, siga las instrucciones del mdico sobre cmo cuidarlo.  Haga respiraciones profundas. Esto ayuda a prevenir que se inflamen los pulmones.  Lyda Perone y prevenir el estreimiento: ? Beba abundantes lquidos. ? Coma muchas frutas y verduras frescas.  Concurra a todas las visitas de control como se lo haya indicado el mdico. Esto es importante. Comunquese con un mdico si:  Aumentan el enrojecimiento, la hinchazn o el dolor alrededor de una incisin.  Le sale ms lquido o sangre de [place] incisin.  La incisin est caliente al tacto.  Tiene pus o un olor ftido que provienen de [place] incisin  médico si:  · Aumentan el enrojecimiento, la hinchazón o el dolor alrededor de una incisión.  · Le sale más líquido o sangre de una incisión.  · La incisión está caliente al tacto.  · Tiene pus o un olor fétido que provienen de una incisión o de un vendaje.  · Se le abren los bordes de la incisión después de que le hayan sacado las suturas.  · Siente más dolor en los hombros.  · Se siente mareado o se desmaya.  · Le falta el aire.  · Sigue  teniendo náuseas o vomitando.  · Tiene diarrea o no pierde el control de las funciones intestinales.  · Pierde el apetito.  · Presenta dolor o hinchazón en las piernas.  Solicite ayuda de inmediato si:  · Tiene fiebre.  · Le aparece una erupción cutánea.  · Tiene dificultad para respirar.  · Siente dolores fuertes en el pecho.  Esta información no tiene como fin reemplazar el consejo del médico. Asegúrese de hacerle al médico cualquier pregunta que tenga.  Document Released: 09/16/2007 Document Revised: 12/03/2016 Document Reviewed: 02/14/2015  Elsevier Interactive Patient Education © 2018 Elsevier Inc.

## 2018-01-13 NOTE — Progress Notes (Signed)
Discharge instructions gone over with patient, verbalized understanding. IV removed, patient tolerated procedure well. Follow up appointment made for patient with Dr. Lovell Sheehan.

## 2018-01-13 NOTE — Discharge Summary (Signed)
Physician Discharge Summary  Patient ID: Brent Roman MRN: [id number] DOB/AGE: April 07, 1986 32 y.o.  Admit date: 01/12/2018 Discharge date: 01/13/2018  Admission Diagnoses: Acute appendicitis  Discharge Diagnoses: Same Active Problems:   Acute appendicitis   Discharged Condition: good  Hospital Course: Patient is a 32 year old Hispanic male who presented to the emergency room with right lower quadrant abdominal pain.  CT scan of the abdomen revealed acute appendicitis.  The patient went to the operating room on 01/12/2018 and underwent laparoscopic appendectomy.  Tolerated the procedure well.  His postoperative course has been unremarkable.  His leukocytosis has almost resolved.  The patient is being discharged home on 01/13/2018 in good and improving condition.  Treatments: surgery: Laparoscopic appendectomy on 01/12/2018  Discharge Exam: Blood pressure 107/63, pulse 65, temperature 98.5 F (36.9 C), temperature source Oral, resp. rate 16, height  (1.676 m), weight 181 lb 14.4 oz (82.5 kg), SpO2 99 %. General appearance: alert, cooperative and no distress Resp: clear to auscultation bilaterally Cardio: regular rate and rhythm, S1, S2 normal, no murmur, click, rub or gallop GI: Soft, incisions healing well.  Disposition: Discharge disposition: 01-Home or Self Care       Discharge Instructions    Diet general   Complete by:  As directed    Increase activity slowly   Complete by:  As directed      Allergies as of 01/13/2018   No Known Allergies     Medication List    TAKE these medications   HYDROcodone-acetaminophen 5-325 MG tablet Commonly known as:  NORCO Take 1 tablet by mouth every 4 (four) hours as needed for moderate pain.   ibuprofen 400 MG tablet Commonly known as:  ADVIL,MOTRIN Take 400 mg by mouth every 6 (six) hours as needed.      Follow-up Information    Franky Macho, MD. Schedule an appointment as soon as possible for a visit on  01/21/2018.   Specialty:  General Surgery Contact information: [address] [place] [address] [phone]           Signed: Franky Macho 01/13/2018, 7:48 AM

## 2018-01-14 ENCOUNTER — Encounter (HOSPITAL_COMMUNITY): Payer: Self-pay | Admitting: General Surgery

## 2018-01-21 ENCOUNTER — Encounter: Payer: Self-pay | Admitting: General Surgery

## 2018-01-21 ENCOUNTER — Encounter (HOSPITAL_COMMUNITY): Payer: Self-pay | Admitting: Adult Health

## 2018-01-21 ENCOUNTER — Emergency Department (HOSPITAL_COMMUNITY)
Admission: EM | Admit: 2018-01-21 | Discharge: 2018-01-21 | Disposition: A | Discharge status: Home / Self Care | Payer: Self-pay | Attending: Emergency Medicine | Admitting: Emergency Medicine

## 2018-01-21 ENCOUNTER — Ambulatory Visit (INDEPENDENT_AMBULATORY_CARE_PROVIDER_SITE_OTHER): Payer: Self-pay | Admitting: General Surgery

## 2018-01-21 ENCOUNTER — Other Ambulatory Visit: Payer: Self-pay

## 2018-01-21 VITALS — BP 121/78 | HR 70 | Temp 97.3°F | Resp 18 | Wt 180.0 lb

## 2018-01-21 DIAGNOSIS — Z87891 Personal history of nicotine dependence: Secondary | ICD-10-CM | POA: Insufficient documentation

## 2018-01-21 DIAGNOSIS — Z09 Encounter for follow-up examination after completed treatment for conditions other than malignant neoplasm: Secondary | ICD-10-CM

## 2018-01-21 DIAGNOSIS — Z79899 Other long term (current) drug therapy: Secondary | ICD-10-CM | POA: Insufficient documentation

## 2018-01-21 DIAGNOSIS — Z4802 Encounter for removal of sutures: Secondary | ICD-10-CM | POA: Insufficient documentation

## 2018-01-21 NOTE — Progress Notes (Signed)
Subjective:     Brent Roman  Status post laparoscopic appendectomy.  Doing well.  Having minimal incisional pain.  No fever or chills. Objective:    BP 121/78 (BP Location: Left Arm, Patient Position: Sitting, Cuff Size: Normal)   Pulse 70   Temp (!) 97.3 F (36.3 C) (Temporal)   Resp 18   Wt 180 lb (81.6 kg)   BMI 29.05 kg/m   General:  alert, cooperative and no distress  Abdomen soft, incisions healing well.  Staples were removed by the emergency room department as the patient got confused as to where he was supposed to go. Final pathology consistent with diagnosis.     Assessment:    Doing well postoperatively.    Plan:   May return to work without restrictions on 01/27/2017.  Follow-up here as needed.

## 2018-01-21 NOTE — ED Provider Notes (Signed)
[facility] EMERGENCY DEPARTMENT Provider Note   CSN: [id number] Arrival date & time: 01/21/18  1413     History   Chief Complaint Chief Complaint  Patient presents with  . Suture / Staple Removal    HPI Brent Roman is a 32 y.o. male.  Patient states that he had an appendectomy over a week ago.  He was to have his wound check and staples removed.  He was sent to the emergency room by Dr. Lovell Sheehan office to have the staples removed.  No problems with the surgical sites according to the patient.  The history is provided by the patient.  Suture / Staple Removal  This is a new problem. The current episode started more than 1 week ago. The problem has been gradually improving. Pertinent negatives include no chest pain, no abdominal pain, no headaches and no shortness of breath. Nothing aggravates the symptoms. Nothing relieves the symptoms. He has tried nothing for the symptoms. The treatment provided no relief.    History reviewed. No pertinent past medical history.  Patient Active Problem List   Diagnosis Date Noted  . Acute appendicitis   . Cellulitis of left groin 01/27/2016  . Cellulitis 01/26/2016  . Cellulitis of right groin     Past Surgical History:  Procedure Laterality Date  . LAPAROSCOPIC APPENDECTOMY N/A 01/12/2018   Procedure: APPENDECTOMY LAPAROSCOPIC;  Surgeon: Franky Macho, MD;  Location: [facility];  Service: General;  Laterality: N/A;        Home Medications    Prior to Admission medications   Medication Sig Start Date End Date Taking? Authorizing Provider  HYDROcodone-acetaminophen (NORCO) 5-325 MG tablet Take 1 tablet by mouth every 4 (four) hours as needed for moderate pain. 01/13/18   Franky Macho, MD  ibuprofen (ADVIL,MOTRIN) 400 MG tablet Take 400 mg by mouth every 6 (six) hours as needed.    [provider]    Family History History reviewed. No pertinent family history.  Social History Social History   Tobacco Use  .  Smoking status: Former Smoker    Packs/day: 0.50    Types: Cigarettes    Last attempt to quit: 09/11/2015    Years since quitting: 2.3  . Smokeless tobacco: Never Used  Substance Use Topics  . Alcohol use: Not Currently    Frequency: Never  . Drug use: No     Allergies   Patient has no known allergies.   Review of Systems Review of Systems  Constitutional: Negative for activity change.       All ROS Neg except as noted in HPI  HENT: Negative for nosebleeds.   Eyes: Negative for photophobia and discharge.  Respiratory: Negative for cough, shortness of breath and wheezing.   Cardiovascular: Negative for chest pain and palpitations.  Gastrointestinal: Negative for abdominal pain and blood in stool.  Genitourinary: Negative for dysuria, frequency and hematuria.  Musculoskeletal: Negative for arthralgias, back pain and neck pain.  Skin: Negative.   Neurological: Negative for dizziness, seizures, speech difficulty and headaches.  Psychiatric/Behavioral: Negative for confusion and hallucinations.     Physical Exam Updated Vital Signs BP 113/73 (BP Location: Right Arm)   Pulse 76   Temp 98.1 F (36.7 C) (Oral)   Resp 14   Wt 82.1 kg (181 lb)   SpO2 100%   BMI 29.21 kg/m   Physical Exam  Constitutional: He is oriented to person, place, and time. He appears well-developed and well-nourished.  Non-toxic appearance.  HENT:  Head: Normocephalic.  Right Ear: Tympanic membrane and external ear normal.  Left Ear: Tympanic membrane and external ear normal.  Eyes: Pupils are equal, round, and reactive to light. EOM and lids are normal.  Neck: Normal range of motion. Neck supple. Carotid bruit is not present.  Cardiovascular: Normal rate, regular rhythm, normal heart sounds, intact distal pulses and normal pulses.  Pulmonary/Chest: Breath sounds normal. No respiratory distress.  Abdominal: Soft. Bowel sounds are normal. There is no tenderness. There is no guarding.  Staples in 3  areas of the abdominal wall are intact.  There is no red streaks appreciated.  No drainage noted.  The areas are not hot.  Musculoskeletal: Normal range of motion.  Lymphadenopathy:       Head (right side): No submandibular adenopathy present.       Head (left side): No submandibular adenopathy present.    He has no cervical adenopathy.  Neurological: He is alert and oriented to person, place, and time. He has normal strength. No cranial nerve deficit or sensory deficit.  Skin: Skin is warm and dry.  Psychiatric: He has a normal mood and affect. His speech is normal.  Nursing note and vitals reviewed.    ED Treatments / Results  Labs (all labs ordered are listed, but only abnormal results are displayed) Labs Reviewed - No data to display  EKG None  Radiology No results found.  Procedures Procedures (including critical care time)  Medications Ordered in ED Medications - No data to display   Initial Impression / Assessment and Plan / ED Course  I have reviewed the triage vital signs and the nursing notes.  Pertinent labs & imaging results that were available during my care of the patient were reviewed by me and considered in my medical decision making (see chart for details).      Final Clinical Impressions(s) / ED Diagnoses MDm  Staples removed by nursing staff.  Wounds appear healing nicely.  Vital signs are well within normal limits.  I have asked the patient to see Dr. Lovell Sheehan for information on when he can safely return to work.  The patient is invited to return to the emergency department if any signs of advancing infection before he is seen by Dr. Lovell Sheehan.   Final diagnoses:  Visit for suture removal    ED Discharge Orders    None       Ivery Quale, PA-C 01/21/18 1519    Bethann Berkshire, MD 01/22/18 [phone]

## 2018-01-21 NOTE — ED Notes (Signed)
Pt denies pain or any fevers.  Incision sites unremarkable.

## 2018-01-21 NOTE — ED Triage Notes (Signed)
PT is requesting removal of his surgical site staples from a laproscopic appendectomy. He states he went to the office and the office sent him here because they were unable to remove them.

## 2018-01-21 NOTE — Discharge Instructions (Addendum)
Please cleanse the wound daily with soap and water.  Please return to the emergency department if any signs of advancing infection.  Please speak to your surgeon  Dr Lovell Sheehan as to when it would be safe for you to return to work duty.

## 2018-12-05 ENCOUNTER — Encounter (HOSPITAL_COMMUNITY): Payer: Self-pay | Admitting: *Deleted

## 2018-12-05 ENCOUNTER — Other Ambulatory Visit: Payer: Self-pay

## 2018-12-05 ENCOUNTER — Emergency Department (HOSPITAL_COMMUNITY)
Admission: EM | Admit: 2018-12-05 | Discharge: 2018-12-05 | Disposition: A | Discharge status: Home / Self Care | Payer: Self-pay | Attending: Emergency Medicine | Admitting: Emergency Medicine

## 2018-12-05 DIAGNOSIS — Z87891 Personal history of nicotine dependence: Secondary | ICD-10-CM | POA: Insufficient documentation

## 2018-12-05 DIAGNOSIS — L237 Allergic contact dermatitis due to plants, except food: Secondary | ICD-10-CM | POA: Insufficient documentation

## 2018-12-05 DIAGNOSIS — Z79899 Other long term (current) drug therapy: Secondary | ICD-10-CM | POA: Insufficient documentation

## 2018-12-05 MED ORDER — PREDNISONE 10 MG PO TABS: ORAL_TABLET | ORAL | 0 refills | Status: DC

## 2018-12-05 MED ORDER — PREDNISONE 50 MG PO TABS
60.0000 mg | ORAL_TABLET | Freq: Once | ORAL | Status: AC
  Administered 2018-12-05: 60 mg via ORAL
  Filled 2018-12-05: qty 1

## 2018-12-05 NOTE — Discharge Instructions (Signed)
Return if any problems.

## 2018-12-05 NOTE — ED Triage Notes (Signed)
Pt c/o rash that started a week ago,

## 2018-12-06 NOTE — ED Provider Notes (Signed)
[facility] EMERGENCY DEPARTMENT Provider Note   CSN: [id number] Arrival date & time: 12/05/18  2038    History   Chief Complaint Chief Complaint  Patient presents with  . Rash    HPI Detrell Lefebvre Bartosz Strenger is a 33 y.o. male.     The history is provided by the patient. No language interpreter was used.  Rash  Location:  Shoulder/arm and leg Shoulder/arm rash location:  L upper arm and R upper arm Leg rash location:  L lower leg and R lower leg Quality: blistering, itchiness and redness   Severity:  Moderate Timing:  Constant Progression:  Worsening Chronicity:  New Context: plant contact   Relieved by:  Nothing Worsened by:  Nothing Pt reports he was exposed to poison ivy  History reviewed. No pertinent past medical history.  Patient Active Problem List   Diagnosis Date Noted  . Acute appendicitis   . Cellulitis of left groin 01/27/2016  . Cellulitis 01/26/2016  . Cellulitis of right groin     Past Surgical History:  Procedure Laterality Date  . LAPAROSCOPIC APPENDECTOMY N/A 01/12/2018   Procedure: APPENDECTOMY LAPAROSCOPIC;  Surgeon: Franky Macho, MD;  Location: [facility];  Service: General;  Laterality: N/A;        Home Medications    Prior to Admission medications   Medication Sig Start Date End Date Taking? Authorizing Provider  HYDROcodone-acetaminophen (NORCO) 5-325 MG tablet Take 1 tablet by mouth every 4 (four) hours as needed for moderate pain. 01/13/18   Franky Macho, MD  ibuprofen (ADVIL,MOTRIN) 400 MG tablet Take 400 mg by mouth every 6 (six) hours as needed.    [provider]  predniSONE (DELTASONE) 10 MG tablet 6,6,5,5,4,4,3,3,2,2,1,1 taper 12/05/18   Elson Areas, PA-C    Family History No family history on file.  Social History Social History   Tobacco Use  . Smoking status: Former Smoker    Packs/day: 0.50    Types: Cigarettes    Last attempt to quit: 09/11/2015    Years since quitting: 3.2  . Smokeless tobacco:  Never Used  Substance Use Topics  . Alcohol use: Not Currently    Frequency: Never  . Drug use: No     Allergies   Patient has no known allergies.   Review of Systems Review of Systems  Skin: Positive for rash.  All other systems reviewed and are negative.    Physical Exam Updated Vital Signs BP (!) 121/58   Pulse 78   Temp 97.9 F (36.6 C) (Oral)   Resp 16   Wt 85.8 kg   SpO2 99%   BMI 30.53 kg/m   Physical Exam Vitals signs and nursing note reviewed.  Constitutional:      Appearance: He is well-developed.  HENT:     Head: Normocephalic and atraumatic.  Eyes:     Conjunctiva/sclera: Conjunctivae normal.  Neck:     Musculoskeletal: Neck supple.  Cardiovascular:     Rate and Rhythm: Normal rate and regular rhythm.     Heart sounds: No murmur.  Pulmonary:     Effort: Pulmonary effort is normal. No respiratory distress.     Breath sounds: Normal breath sounds.  Abdominal:     Palpations: Abdomen is soft.     Tenderness: There is no abdominal tenderness.  Skin:    General: Skin is warm.     Findings: Rash present.     Comments: linear raised teratomatous rash  Neurological:     Mental Status: He  is alert.      ED Treatments / Results  Labs (all labs ordered are listed, but only abnormal results are displayed) Labs Reviewed - No data to display  EKG None  Radiology No results found.  Procedures Procedures (including critical care time)  Medications Ordered in ED Medications  predniSONE (DELTASONE) tablet 60 mg (60 mg Oral Given 12/05/18 2149)     Initial Impression / Assessment and Plan / ED Course  I have reviewed the triage vital signs and the nursing notes.  Pertinent labs & imaging results that were available during my care of the patient were reviewed by me and considered in my medical decision making (see chart for details).        An After Visit Summary was printed and given to the patient.   Final Clinical Impressions(s) /  ED Diagnoses   Final diagnoses:  Poison ivy    ED Discharge Orders         Ordered    predniSONE (DELTASONE) 10 MG tablet     12/05/18 2138           Elson Areas, [place] 12/06/18 0044    Vanetta Mulders, MD 12/16/18 [phone]

## 2020-08-24 ENCOUNTER — Encounter (HOSPITAL_COMMUNITY): Payer: Self-pay | Admitting: Emergency Medicine

## 2020-08-24 ENCOUNTER — Other Ambulatory Visit: Payer: Self-pay

## 2020-08-24 ENCOUNTER — Emergency Department (HOSPITAL_COMMUNITY)
Admission: EM | Admit: 2020-08-24 | Discharge: 2020-08-24 | Disposition: A | Discharge status: Home / Self Care | Payer: Self-pay | Attending: Emergency Medicine | Admitting: Emergency Medicine

## 2020-08-24 DIAGNOSIS — Z87891 Personal history of nicotine dependence: Secondary | ICD-10-CM | POA: Insufficient documentation

## 2020-08-24 DIAGNOSIS — L255 Unspecified contact dermatitis due to plants, except food: Secondary | ICD-10-CM | POA: Insufficient documentation

## 2020-08-24 MED ORDER — PREDNISONE 50 MG PO TABS: 50.0000 mg | ORAL_TABLET | Freq: Every day | ORAL | 0 refills | Status: AC

## 2020-08-24 MED ORDER — HYDROXYZINE HCL 25 MG PO TABS: 25.0000 mg | ORAL_TABLET | Freq: Four times a day (QID) | ORAL | 0 refills | Status: AC

## 2020-08-24 NOTE — ED Triage Notes (Signed)
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 Pt states he was exposed to poison ivy.

## 2020-08-24 NOTE — Discharge Instructions (Signed)
Tome la prednisona segn lo prescrito para el sarpullido. Tome Atarax para la picazn. Si no mejora dentro de los [address], regrese por sntomas nuevos o que [employer]

## 2020-08-24 NOTE — ED Provider Notes (Signed)
[facility] EMERGENCY DEPARTMENT Provider Note   CSN: [id number] Arrival date & time: 08/24/20  1625    History Chief Complaint  Patient presents with  . Rash    Brent Roman is a 34 y.o. male with no significant past medical history who presents for evaluation of rash.  Works as a Administrator.  Took his shirt off last week while picking up leaves and cutting down a tree and subsequently developed a pruritic rash.  Initially started to bilateral upper extremities however spread to his trunk.  Rash is pruritic in nature.  Not located GU region, palms or soles, face.  No new lotions, perfumes.  Nontender to palpation.  Using calamine lotion for this.  Denies fever, chills, lightness, dizziness, chest pain, shortness breath abdominal pain, or hematuria.  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  Denies additional aggravating or alleviating factors.  No pain.  History of pain from patient and past medical records.  No interpreter used.  HPI     History reviewed. No pertinent past medical history.  Patient Active Problem List   Diagnosis Date Noted  . Acute appendicitis   . Cellulitis of left groin 01/27/2016  . Cellulitis 01/26/2016  . Cellulitis of right groin     Past Surgical History:  Procedure Laterality Date  . LAPAROSCOPIC APPENDECTOMY N/A 01/12/2018   Procedure: APPENDECTOMY LAPAROSCOPIC;  Surgeon: Franky Macho, MD;  Location: [facility];  Service: General;  Laterality: N/A;       History reviewed. No pertinent family history.  Social History   Tobacco Use  . Smoking status: Former Smoker    Packs/day: 0.50    Types: Cigarettes    Quit date: 09/11/2015    Years since quitting: 4.9  . Smokeless tobacco: Never Used  Vaping Use  . Vaping Use: Never used  Substance Use Topics  . Alcohol use: Not Currently    Alcohol/week: 12.0 standard drinks    Types: 12 Cans of beer per week  . Drug use: No    Home Medications Prior to Admission medications   Medication Sig Start Date  End Date Taking? Authorizing Provider  HYDROcodone-acetaminophen (NORCO) 5-325 MG tablet Take 1 tablet by mouth every 4 (four) hours as needed for moderate pain. 01/13/18   Franky Macho, MD  hydrOXYzine (ATARAX/VISTARIL) 25 MG tablet Take 1 tablet (25 mg total) by mouth every 6 (six) hours. 08/24/20   Merriam Brandner A, PA-C  ibuprofen (ADVIL,MOTRIN) 400 MG tablet Take 400 mg by mouth every 6 (six) hours as needed.    [provider]  predniSONE (DELTASONE) 50 MG tablet Take 1 tablet (50 mg total) by mouth daily for 5 days. 08/24/20 08/29/20  Hayat Warbington A, PA-C    Allergies    Patient has no known allergies.  Review of Systems   Review of Systems  Constitutional: Negative.   HENT: Negative.   Respiratory: Negative.   Cardiovascular: Negative.   Gastrointestinal: Negative.   Genitourinary: Negative.   Musculoskeletal: Negative.   Skin: Positive for rash.  Neurological: Negative.   All other systems reviewed and are negative.   Physical Exam Updated Vital Signs BP 133/80 (BP Location: Right Arm)   Pulse 95   Temp 98.4 F (36.9 C) (Oral)   Resp 18   Wt 83.9 kg   SpO2 100%   BMI 29.86 kg/m   Physical Exam Vitals and nursing note reviewed.  Constitutional:      General: He is not in acute distress.    Appearance: He  is well-developed and well-nourished. He is not ill-appearing, toxic-appearing or diaphoretic.  HENT:     Head: Normocephalic and atraumatic.     Nose: Nose normal.     Mouth/Throat:     Mouth: Mucous membranes are moist.  Eyes:     Pupils: Pupils are equal, round, and reactive to light.  Cardiovascular:     Rate and Rhythm: Normal rate and regular rhythm.     Pulses: Normal pulses.     Heart sounds: Normal heart sounds.  Pulmonary:     Effort: Pulmonary effort is normal. No respiratory distress.     Breath sounds: Normal breath sounds.  Abdominal:     General: Bowel sounds are normal. There is no distension.     Palpations: Abdomen is  soft.     Tenderness: There is no abdominal tenderness.  Musculoskeletal:        General: Normal range of motion.     Cervical back: Normal range of motion and neck supple.  Skin:    General: Skin is warm and dry.     Capillary Refill: Capillary refill takes less than 2 seconds.     Comments: Erythematous, papular rash to bilateral upper extremities. No target lesions, bulla, desquamated skin. See pictures in chart. No rash to palms or soles.  Neurological:     General: No focal deficit present.     Mental Status: He is alert and oriented to person, place, and time.  Psychiatric:        Mood and Affect: Mood and affect normal.           ED Results / Procedures / Treatments   Labs (all labs ordered are listed, but only abnormal results are displayed) Labs Reviewed - No data to display  EKG None  Radiology No results found.  Procedures Procedures (including critical care time)  Medications Ordered in ED Medications - No data to display  ED Course  I have reviewed the triage vital signs and the nursing notes.  Pertinent labs & imaging results that were available during my care of the patient were reviewed by me and considered in my medical decision making (see chart for details).  Rash after exposure to leaves outside. No IVDU. Clear airway. No rash to palms or soles or feet.  Rash consistent with contact dermatitis. Patient denies any difficulty breathing or swallowing.  Pt has a patent airway without stridor and is handling secretions without difficulty; no angioedema. No blisters, no pustules, no warmth, no draining sinus tracts, no superficial abscesses, no bullous impetigo, no vesicles, no desquamation, no target lesions with dusky purpura or a central bulla. Not tender to touch. No concern for superimposed infection. No concern for SJS, TEN, TSS, tick borne illness, syphilis, rash secondary to endocarditis or other life-threatening condition. Will discharge home with  short course of steroids, and recommend Hydroxyzine as needed for pruritis.  The patient has been appropriately medically screened and/or stabilized in the ED. I have low suspicion for any other emergent medical condition which would require further screening, evaluation or treatment in the ED or require inpatient management.  Patient is hemodynamically stable and in no acute distress.  Patient able to ambulate in department prior to ED.  Evaluation does not show acute pathology that would require ongoing or additional emergent interventions while in the emergency department or further inpatient treatment.  I have discussed the diagnosis with the patient and answered all questions.  Pain is been managed while in the emergency department  and patient has no further complaints prior to discharge.  Patient is comfortable with plan discussed in room and is stable for discharge at this time.  I have discussed strict return precautions for returning to the emergency department.  Patient was encouraged to follow-up with PCP/specialist refer to at discharge.    MDM Rules/Calculators/A&P                           Final Clinical Impression(s) / ED Diagnoses Final diagnoses:  Contact dermatitis due to plants, except food, unspecified contact dermatitis type    Rx / DC Orders ED Discharge Orders         Ordered    predniSONE (DELTASONE) 50 MG tablet  Daily        08/24/20 1720    hydrOXYzine (ATARAX/VISTARIL) 25 MG tablet  Every 6 hours        08/24/20 1720           Khale Nigh A, PA-C 08/24/20 1748    Maia Plan, MD 08/30/20 [phone]
